# Patient Record
Sex: Female | Born: 1960 | Race: Black or African American | Hispanic: No | Marital: Single | Smoking: Never smoker
Health system: Southern US, Community
[De-identification: ages and names within clinical notes are randomized; demographics above are authoritative.]

## PROBLEM LIST (undated history)

## (undated) ENCOUNTER — Ambulatory Visit (HOSPITAL_COMMUNITY): Admission: EM | Disposition: A | Discharge status: Home / Self Care | Payer: Self-pay

## (undated) ENCOUNTER — Emergency Department (HOSPITAL_COMMUNITY): Admission: EM | Payer: Self-pay | Source: Home / Self Care

## (undated) DIAGNOSIS — K219 Gastro-esophageal reflux disease without esophagitis: Secondary | ICD-10-CM

## (undated) DIAGNOSIS — E559 Vitamin D deficiency, unspecified: Secondary | ICD-10-CM

## (undated) DIAGNOSIS — T7840XA Allergy, unspecified, initial encounter: Secondary | ICD-10-CM

## (undated) DIAGNOSIS — J302 Other seasonal allergic rhinitis: Secondary | ICD-10-CM

## (undated) DIAGNOSIS — D649 Anemia, unspecified: Secondary | ICD-10-CM

## (undated) DIAGNOSIS — F22 Delusional disorders: Secondary | ICD-10-CM

## (undated) DIAGNOSIS — F419 Anxiety disorder, unspecified: Secondary | ICD-10-CM

## (undated) HISTORY — DX: Vitamin D deficiency, unspecified: E55.9

## (undated) HISTORY — DX: Other seasonal allergic rhinitis: J30.2

## (undated) HISTORY — PX: BREAST SURGERY: SHX581

## (undated) HISTORY — DX: Allergy, unspecified, initial encounter: T78.40XA

## (undated) HISTORY — PX: BREAST BIOPSY: SHX20

## (undated) HISTORY — DX: Anemia, unspecified: D64.9

## (undated) HISTORY — DX: Anxiety disorder, unspecified: F41.9

## (undated) HISTORY — DX: Delusional disorders: F22

---

## 1997-10-31 DIAGNOSIS — N6019 Diffuse cystic mastopathy of unspecified breast: Secondary | ICD-10-CM | POA: Insufficient documentation

## 1997-11-05 ENCOUNTER — Ambulatory Visit (HOSPITAL_COMMUNITY): Admission: RE | Admit: 1997-11-05 | Discharge: 1997-11-05 | Payer: Self-pay | Admitting: *Deleted

## 1997-11-22 ENCOUNTER — Ambulatory Visit (HOSPITAL_COMMUNITY): Admission: RE | Admit: 1997-11-22 | Discharge: 1997-11-22 | Payer: Self-pay | Admitting: *Deleted

## 1998-09-23 ENCOUNTER — Emergency Department (HOSPITAL_COMMUNITY): Admission: EM | Admit: 1998-09-23 | Discharge: 1998-09-23 | Payer: Self-pay | Admitting: Emergency Medicine

## 1998-10-17 ENCOUNTER — Ambulatory Visit (HOSPITAL_COMMUNITY): Admission: RE | Admit: 1998-10-17 | Discharge: 1998-10-17 | Payer: Self-pay | Admitting: *Deleted

## 2000-03-25 ENCOUNTER — Encounter: Payer: Self-pay | Admitting: *Deleted

## 2000-03-25 ENCOUNTER — Ambulatory Visit (HOSPITAL_COMMUNITY): Admission: RE | Admit: 2000-03-25 | Discharge: 2000-03-25 | Payer: Self-pay | Admitting: *Deleted

## 2001-04-11 ENCOUNTER — Other Ambulatory Visit: Admission: RE | Admit: 2001-04-11 | Discharge: 2001-04-11 | Payer: Self-pay | Admitting: *Deleted

## 2001-04-15 ENCOUNTER — Encounter: Payer: Self-pay | Admitting: *Deleted

## 2001-04-15 ENCOUNTER — Ambulatory Visit (HOSPITAL_COMMUNITY): Admission: RE | Admit: 2001-04-15 | Discharge: 2001-04-15 | Payer: Self-pay | Admitting: *Deleted

## 2001-10-02 ENCOUNTER — Emergency Department (HOSPITAL_COMMUNITY): Admission: EM | Admit: 2001-10-02 | Discharge: 2001-10-02 | Payer: Self-pay

## 2002-04-18 ENCOUNTER — Ambulatory Visit (HOSPITAL_COMMUNITY): Admission: RE | Admit: 2002-04-18 | Discharge: 2002-04-18 | Payer: Self-pay | Admitting: *Deleted

## 2002-04-18 ENCOUNTER — Encounter: Payer: Self-pay | Admitting: *Deleted

## 2003-03-13 ENCOUNTER — Other Ambulatory Visit: Admission: RE | Admit: 2003-03-13 | Discharge: 2003-03-13 | Payer: Self-pay | Admitting: Family Medicine

## 2003-04-23 ENCOUNTER — Ambulatory Visit (HOSPITAL_COMMUNITY): Admission: RE | Admit: 2003-04-23 | Discharge: 2003-04-23 | Payer: Self-pay | Admitting: *Deleted

## 2003-04-23 ENCOUNTER — Encounter: Payer: Self-pay | Admitting: *Deleted

## 2004-04-17 DIAGNOSIS — K648 Other hemorrhoids: Secondary | ICD-10-CM | POA: Insufficient documentation

## 2004-04-23 ENCOUNTER — Ambulatory Visit (HOSPITAL_COMMUNITY): Admission: RE | Admit: 2004-04-23 | Discharge: 2004-04-23 | Payer: Self-pay | Admitting: Family Medicine

## 2005-04-27 ENCOUNTER — Ambulatory Visit (HOSPITAL_COMMUNITY): Admission: RE | Admit: 2005-04-27 | Discharge: 2005-04-27 | Payer: Self-pay | Admitting: Family Medicine

## 2005-10-23 ENCOUNTER — Other Ambulatory Visit: Admission: RE | Admit: 2005-10-23 | Discharge: 2005-10-23 | Payer: Self-pay | Admitting: Family Medicine

## 2005-10-26 ENCOUNTER — Encounter (INDEPENDENT_AMBULATORY_CARE_PROVIDER_SITE_OTHER): Payer: Self-pay | Admitting: Nurse Practitioner

## 2005-11-09 ENCOUNTER — Encounter: Admission: RE | Admit: 2005-11-09 | Discharge: 2005-11-09 | Payer: Self-pay | Admitting: Family Medicine

## 2006-07-06 ENCOUNTER — Emergency Department (HOSPITAL_COMMUNITY): Admission: EM | Admit: 2006-07-06 | Discharge: 2006-07-06 | Payer: Self-pay | Admitting: Emergency Medicine

## 2006-08-05 ENCOUNTER — Emergency Department (HOSPITAL_COMMUNITY): Admission: EM | Admit: 2006-08-05 | Discharge: 2006-08-06 | Payer: Self-pay | Admitting: Emergency Medicine

## 2006-08-11 ENCOUNTER — Other Ambulatory Visit: Admission: RE | Admit: 2006-08-11 | Discharge: 2006-08-11 | Payer: Self-pay | Admitting: Obstetrics and Gynecology

## 2006-09-06 ENCOUNTER — Ambulatory Visit (HOSPITAL_COMMUNITY): Admission: RE | Admit: 2006-09-06 | Discharge: 2006-09-06 | Payer: Self-pay | Admitting: Family Medicine

## 2007-03-16 ENCOUNTER — Ambulatory Visit (HOSPITAL_COMMUNITY): Admission: RE | Admit: 2007-03-16 | Discharge: 2007-03-16 | Payer: Self-pay | Admitting: Family Medicine

## 2007-09-04 ENCOUNTER — Emergency Department (HOSPITAL_COMMUNITY): Admission: EM | Admit: 2007-09-04 | Discharge: 2007-09-04 | Payer: Self-pay | Admitting: Emergency Medicine

## 2007-11-13 ENCOUNTER — Emergency Department (HOSPITAL_COMMUNITY): Admission: EM | Admit: 2007-11-13 | Discharge: 2007-11-13 | Payer: Self-pay | Admitting: Emergency Medicine

## 2008-03-29 ENCOUNTER — Emergency Department (HOSPITAL_COMMUNITY): Admission: EM | Admit: 2008-03-29 | Discharge: 2008-03-29 | Payer: Self-pay | Admitting: Family Medicine

## 2008-04-11 ENCOUNTER — Emergency Department (HOSPITAL_COMMUNITY): Admission: EM | Admit: 2008-04-11 | Discharge: 2008-04-11 | Payer: Self-pay | Admitting: Emergency Medicine

## 2008-06-20 ENCOUNTER — Ambulatory Visit: Payer: Self-pay | Admitting: Nurse Practitioner

## 2008-06-20 DIAGNOSIS — H547 Unspecified visual loss: Secondary | ICD-10-CM

## 2008-06-20 DIAGNOSIS — K219 Gastro-esophageal reflux disease without esophagitis: Secondary | ICD-10-CM

## 2008-06-20 LAB — CONVERTED CEMR LAB
Albumin: 4.4 g/dL (ref 3.5–5.2)
BUN: 12 mg/dL (ref 6–23)
CO2: 26 meq/L (ref 19–32)
Calcium: 9.2 mg/dL (ref 8.4–10.5)
Creatinine, Ser: 0.83 mg/dL (ref 0.40–1.20)
Eosinophils Absolute: 0.2 10*3/uL (ref 0.0–0.7)
Eosinophils Relative: 3 % (ref 0–5)
Glucose, Bld: 65 mg/dL — ABNORMAL LOW (ref 70–99)
MCHC: 32.7 g/dL (ref 30.0–36.0)
MCV: 90.8 fL (ref 78.0–100.0)
Monocytes Absolute: 0.4 10*3/uL (ref 0.1–1.0)
Monocytes Relative: 8 % (ref 3–12)
Neutro Abs: 2.7 10*3/uL (ref 1.7–7.7)
Neutrophils Relative %: 52 % (ref 43–77)
Potassium: 4.4 meq/L (ref 3.5–5.3)
RBC: 4.58 M/uL (ref 3.87–5.11)
Total Bilirubin: 0.4 mg/dL (ref 0.3–1.2)
Total Protein: 7.5 g/dL (ref 6.0–8.3)
WBC: 5.1 10*3/uL (ref 4.0–10.5)

## 2008-06-21 ENCOUNTER — Encounter (INDEPENDENT_AMBULATORY_CARE_PROVIDER_SITE_OTHER): Payer: Self-pay | Admitting: Nurse Practitioner

## 2008-06-26 ENCOUNTER — Encounter (INDEPENDENT_AMBULATORY_CARE_PROVIDER_SITE_OTHER): Payer: Self-pay | Admitting: Nurse Practitioner

## 2008-06-26 ENCOUNTER — Ambulatory Visit (HOSPITAL_COMMUNITY): Admission: RE | Admit: 2008-06-26 | Discharge: 2008-06-26 | Payer: Self-pay | Admitting: Family Medicine

## 2008-07-27 ENCOUNTER — Emergency Department (HOSPITAL_COMMUNITY): Admission: EM | Admit: 2008-07-27 | Discharge: 2008-07-27 | Payer: Self-pay | Admitting: Emergency Medicine

## 2008-08-29 ENCOUNTER — Ambulatory Visit (HOSPITAL_COMMUNITY): Admission: RE | Admit: 2008-08-29 | Discharge: 2008-08-29 | Payer: Self-pay | Admitting: Family Medicine

## 2008-10-03 ENCOUNTER — Ambulatory Visit: Payer: Self-pay | Admitting: Nurse Practitioner

## 2008-10-03 DIAGNOSIS — J069 Acute upper respiratory infection, unspecified: Secondary | ICD-10-CM | POA: Insufficient documentation

## 2008-10-03 DIAGNOSIS — R609 Edema, unspecified: Secondary | ICD-10-CM | POA: Insufficient documentation

## 2008-10-06 ENCOUNTER — Emergency Department (HOSPITAL_COMMUNITY): Admission: EM | Admit: 2008-10-06 | Discharge: 2008-10-06 | Payer: Self-pay | Admitting: Emergency Medicine

## 2008-10-08 ENCOUNTER — Ambulatory Visit: Payer: Self-pay | Admitting: Nurse Practitioner

## 2008-10-10 ENCOUNTER — Encounter (INDEPENDENT_AMBULATORY_CARE_PROVIDER_SITE_OTHER): Payer: Self-pay | Admitting: *Deleted

## 2008-10-11 ENCOUNTER — Encounter (INDEPENDENT_AMBULATORY_CARE_PROVIDER_SITE_OTHER): Payer: Self-pay | Admitting: Nurse Practitioner

## 2008-10-11 DIAGNOSIS — I959 Hypotension, unspecified: Secondary | ICD-10-CM | POA: Insufficient documentation

## 2008-10-12 ENCOUNTER — Encounter (INDEPENDENT_AMBULATORY_CARE_PROVIDER_SITE_OTHER): Payer: Self-pay | Admitting: Nurse Practitioner

## 2008-10-29 ENCOUNTER — Emergency Department (HOSPITAL_COMMUNITY): Admission: EM | Admit: 2008-10-29 | Discharge: 2008-10-30 | Payer: Self-pay | Admitting: Emergency Medicine

## 2008-11-30 ENCOUNTER — Ambulatory Visit: Payer: Self-pay | Admitting: Nurse Practitioner

## 2008-11-30 DIAGNOSIS — R51 Headache: Secondary | ICD-10-CM

## 2008-11-30 DIAGNOSIS — F22 Delusional disorders: Secondary | ICD-10-CM | POA: Insufficient documentation

## 2008-11-30 LAB — CONVERTED CEMR LAB
ALT: 26 units/L (ref 0–35)
AST: 17 units/L (ref 0–37)
BUN: 8 mg/dL (ref 6–23)
Basophils Absolute: 0 10*3/uL (ref 0.0–0.1)
CO2: 24 meq/L (ref 19–32)
Calcium: 9.1 mg/dL (ref 8.4–10.5)
Chloride: 102 meq/L (ref 96–112)
Eosinophils Absolute: 0.1 10*3/uL (ref 0.0–0.7)
Eosinophils Relative: 2 % (ref 0–5)
HCT: 41.8 % (ref 36.0–46.0)
Lymphocytes Relative: 30 % (ref 12–46)
Neutro Abs: 3.8 10*3/uL (ref 1.7–7.7)
Platelets: 247 10*3/uL (ref 150–400)
Potassium: 4.1 meq/L (ref 3.5–5.3)
RBC: 4.53 M/uL (ref 3.87–5.11)
RDW: 13.5 % (ref 11.5–15.5)
Total Bilirubin: 0.9 mg/dL (ref 0.3–1.2)
WBC: 6.3 10*3/uL (ref 4.0–10.5)

## 2008-12-03 ENCOUNTER — Encounter (INDEPENDENT_AMBULATORY_CARE_PROVIDER_SITE_OTHER): Payer: Self-pay | Admitting: Nurse Practitioner

## 2009-01-10 ENCOUNTER — Ambulatory Visit: Payer: Self-pay | Admitting: Nurse Practitioner

## 2009-02-12 ENCOUNTER — Ambulatory Visit: Payer: Self-pay | Admitting: Nurse Practitioner

## 2009-02-19 ENCOUNTER — Encounter (INDEPENDENT_AMBULATORY_CARE_PROVIDER_SITE_OTHER): Payer: Self-pay | Admitting: Nurse Practitioner

## 2009-03-12 ENCOUNTER — Encounter (INDEPENDENT_AMBULATORY_CARE_PROVIDER_SITE_OTHER): Payer: Self-pay | Admitting: Nurse Practitioner

## 2009-03-16 ENCOUNTER — Emergency Department (HOSPITAL_COMMUNITY): Admission: EM | Admit: 2009-03-16 | Discharge: 2009-03-16 | Payer: Self-pay | Admitting: Emergency Medicine

## 2009-03-18 ENCOUNTER — Telehealth (INDEPENDENT_AMBULATORY_CARE_PROVIDER_SITE_OTHER): Payer: Self-pay | Admitting: Nurse Practitioner

## 2009-06-20 ENCOUNTER — Ambulatory Visit: Payer: Self-pay | Admitting: Nurse Practitioner

## 2009-06-20 DIAGNOSIS — M542 Cervicalgia: Secondary | ICD-10-CM | POA: Insufficient documentation

## 2009-11-20 ENCOUNTER — Ambulatory Visit (HOSPITAL_COMMUNITY): Admission: RE | Admit: 2009-11-20 | Discharge: 2009-11-20 | Payer: Self-pay | Admitting: Family Medicine

## 2009-12-14 ENCOUNTER — Emergency Department (HOSPITAL_COMMUNITY): Admission: EM | Admit: 2009-12-14 | Discharge: 2009-12-14 | Payer: Self-pay | Admitting: Emergency Medicine

## 2010-03-10 ENCOUNTER — Encounter (INDEPENDENT_AMBULATORY_CARE_PROVIDER_SITE_OTHER): Payer: Self-pay | Admitting: Nurse Practitioner

## 2010-09-09 ENCOUNTER — Telehealth (INDEPENDENT_AMBULATORY_CARE_PROVIDER_SITE_OTHER): Payer: Self-pay | Admitting: Nurse Practitioner

## 2010-09-20 ENCOUNTER — Emergency Department (HOSPITAL_COMMUNITY)
Admission: EM | Admit: 2010-09-20 | Discharge: 2010-09-20 | Payer: Self-pay | Source: Home / Self Care | Admitting: Family Medicine

## 2010-10-14 ENCOUNTER — Ambulatory Visit: Admit: 2010-10-14 | Payer: Self-pay | Admitting: Nurse Practitioner

## 2010-11-04 NOTE — Letter (Signed)
Summary: MAILED REQUESTEED RECORDS TO [facility]  MAILED REQUESTEED RECORDS TO [facility]   Imported By: Arta Bruce 03/10/2010 16:30:24  _____________________________________________________________________  External Attachment:    Type:   Image     Comment:   External Document

## 2010-11-04 NOTE — Progress Notes (Signed)
Summary: SKIN PEELING/BUMPS ON ANKLE  Phone Note Call from Patient Call back at Home Phone [phone]   Reason for Call: Talk to Nurse Summary of Call: [name] CALLE AND SAYS THAT SHE HAS SOME RED BUMPS THAT ARE AROUND HER ANKLE AREA, THEY DO NOT HURT AND HAVE NO PAIN W/IT, ALSO THE SKIN IS PEELING AND SHE SAYS THAT IT HAS SOME TIGHTNESS . Initial call taken by: Leodis Rains,  September 09, 2010 10:23 AM  Follow-up for Phone Call        Voicemail has not been set up yet.  Dutch Quint RN  September 09, 2010 4:03 PM   No answer, Voicemail has not been setup yet. Gaylyn Cheers RN  September 10, 2010 8:50 AM  no answer, voicemail has not been setup yet. have attempted 3 different days to call pt. back and have been unable to reach her. Will sign off expect pt will call us back if she needs to.    Follow-up by: Gaylyn Cheers RN,  September 11, 2010 2:54 PM

## 2010-12-09 ENCOUNTER — Other Ambulatory Visit (HOSPITAL_COMMUNITY): Payer: Self-pay | Admitting: Family Medicine

## 2010-12-09 DIAGNOSIS — Z1231 Encounter for screening mammogram for malignant neoplasm of breast: Secondary | ICD-10-CM

## 2010-12-11 ENCOUNTER — Ambulatory Visit (HOSPITAL_COMMUNITY)
Admission: RE | Admit: 2010-12-11 | Discharge: 2010-12-11 | Disposition: A | Discharge status: Home / Self Care | Payer: Self-pay | Source: Ambulatory Visit | Attending: Family Medicine | Admitting: Family Medicine

## 2010-12-11 DIAGNOSIS — Z1231 Encounter for screening mammogram for malignant neoplasm of breast: Secondary | ICD-10-CM | POA: Insufficient documentation

## 2011-01-19 LAB — COMPREHENSIVE METABOLIC PANEL
ALT: 27 U/L (ref 0–35)
AST: 33 U/L (ref 0–37)
CO2: 24 mEq/L (ref 19–32)
Calcium: 8.8 mg/dL (ref 8.4–10.5)
Creatinine, Ser: 0.64 mg/dL (ref 0.4–1.2)
GFR calc non Af Amer: >60 mL/min (ref >60)
Total Bilirubin: 0.7 mg/dL (ref 0.3–1.2)

## 2011-01-19 LAB — URINALYSIS, ROUTINE W REFLEX MICROSCOPIC
Glucose, UA: NEGATIVE mg/dL
Ketones, ur: NEGATIVE mg/dL
Protein, ur: NEGATIVE mg/dL

## 2011-01-19 LAB — CBC
HCT: 36.3 % (ref 36.0–46.0)
MCHC: 33.9 g/dL (ref 30.0–36.0)
MCV: 92 fL (ref 78.0–100.0)
RBC: 3.95 MIL/uL (ref 3.87–5.11)
RDW: 12.7 % (ref 11.5–15.5)
WBC: 5.8 10*3/uL (ref 4.0–10.5)

## 2011-01-19 LAB — URINE MICROSCOPIC-ADD ON

## 2011-01-19 LAB — DIFFERENTIAL
Basophils Absolute: 0.1 10*3/uL (ref 0.0–0.1)
Neutrophils Relative %: 56 % (ref 43–77)

## 2011-01-19 LAB — LIPASE, BLOOD: Lipase: 47 U/L (ref 11–59)

## 2011-03-17 ENCOUNTER — Emergency Department (HOSPITAL_COMMUNITY)
Admission: EM | Admit: 2011-03-17 | Discharge: 2011-03-17 | Discharge status: Against Medical Advice | Payer: Self-pay | Attending: Emergency Medicine | Admitting: Emergency Medicine

## 2011-03-17 DIAGNOSIS — R109 Unspecified abdominal pain: Secondary | ICD-10-CM | POA: Insufficient documentation

## 2011-06-07 ENCOUNTER — Emergency Department (HOSPITAL_COMMUNITY)
Admission: EM | Admit: 2011-06-07 | Discharge: 2011-06-07 | Disposition: A | Discharge status: Home / Self Care | Payer: Self-pay | Attending: Emergency Medicine | Admitting: Emergency Medicine

## 2011-06-07 ENCOUNTER — Emergency Department (HOSPITAL_COMMUNITY): Payer: Self-pay

## 2011-06-07 DIAGNOSIS — R6884 Jaw pain: Secondary | ICD-10-CM | POA: Insufficient documentation

## 2011-06-07 DIAGNOSIS — R42 Dizziness and giddiness: Secondary | ICD-10-CM | POA: Insufficient documentation

## 2011-06-07 DIAGNOSIS — R209 Unspecified disturbances of skin sensation: Secondary | ICD-10-CM | POA: Insufficient documentation

## 2011-06-07 DIAGNOSIS — R51 Headache: Secondary | ICD-10-CM | POA: Insufficient documentation

## 2011-06-07 LAB — CBC
MCV: 88.8 fL (ref 78.0–100.0)
Platelets: 232 10*3/uL (ref 150–400)
RDW: 13.2 % (ref 11.5–15.5)
WBC: 7.4 10*3/uL (ref 4.0–10.5)

## 2011-06-07 LAB — URINE MICROSCOPIC-ADD ON

## 2011-06-07 LAB — POCT I-STAT, CHEM 8
BUN: 9 mg/dL (ref 6–23)
Calcium, Ion: 1.11 mmol/L — ABNORMAL LOW (ref 1.12–1.32)
Chloride: 105 meq/L (ref 96–112)
Creatinine, Ser: 0.8 mg/dL (ref 0.50–1.10)
Glucose, Bld: 101 mg/dL — ABNORMAL HIGH (ref 70–99)
HCT: 41 % (ref 36.0–46.0)
Hemoglobin: 13.9 g/dL (ref 12.0–15.0)
Potassium: 4.1 meq/L (ref 3.5–5.1)
Sodium: 138 meq/L (ref 135–145)
TCO2: 23 mmol/L (ref 0–100)

## 2011-06-07 LAB — POCT I-STAT TROPONIN I: Troponin i, poc: 0 ng/mL (ref 0.00–0.08)

## 2011-06-07 LAB — URINALYSIS, ROUTINE W REFLEX MICROSCOPIC
Glucose, UA: NEGATIVE mg/dL
pH: 7.5 (ref 5.0–8.0)

## 2011-06-07 LAB — DIFFERENTIAL
Eosinophils Absolute: 0 10*3/uL (ref 0.0–0.7)
Eosinophils Relative: 0 % (ref 0–5)
Lymphs Abs: 1.2 10*3/uL (ref 0.7–4.0)

## 2011-06-26 LAB — BASIC METABOLIC PANEL
BUN: 4 — ABNORMAL LOW
CO2: 25
Chloride: 107
GFR calc non Af Amer: >60
Glucose, Bld: 106 — ABNORMAL HIGH
Potassium: 3.5

## 2011-06-26 LAB — POCT CARDIAC MARKERS
CKMB, poc: 1.3
Myoglobin, poc: 50.8
Operator id: 4533

## 2011-06-26 LAB — DIFFERENTIAL
Basophils Absolute: 0
Basophils Relative: 1
Eosinophils Absolute: 0
Eosinophils Relative: 0

## 2011-06-26 LAB — CBC
HCT: 37.4
MCHC: 33.5
MCV: 90.2
Platelets: 206
RDW: 12.9

## 2011-06-26 LAB — POCT PREGNANCY, URINE: Preg Test, Ur: NEGATIVE

## 2011-06-26 LAB — D-DIMER, QUANTITATIVE: D-Dimer, Quant: 0.37

## 2011-07-02 LAB — POCT PREGNANCY, URINE
Operator id: 239701
Preg Test, Ur: NEGATIVE

## 2011-07-02 LAB — POCT URINALYSIS DIP (DEVICE)
Hgb urine dipstick: NEGATIVE
Nitrite: NEGATIVE
Protein, ur: NEGATIVE
pH: 8.5 — ABNORMAL HIGH

## 2011-07-02 LAB — URINE CULTURE

## 2011-07-06 ENCOUNTER — Emergency Department (HOSPITAL_COMMUNITY)
Admission: EM | Admit: 2011-07-06 | Discharge: 2011-07-06 | Discharge status: Against Medical Advice | Payer: Self-pay | Attending: Emergency Medicine | Admitting: Emergency Medicine

## 2011-07-06 LAB — URINALYSIS, ROUTINE W REFLEX MICROSCOPIC
Glucose, UA: NEGATIVE
Hgb urine dipstick: NEGATIVE
Specific Gravity, Urine: 1.015
Urobilinogen, UA: 0.2

## 2011-07-06 LAB — POCT PREGNANCY, URINE: Preg Test, Ur: NEGATIVE

## 2011-07-24 ENCOUNTER — Emergency Department (HOSPITAL_COMMUNITY)
Admission: EM | Admit: 2011-07-24 | Discharge: 2011-07-25 | Disposition: A | Discharge status: Home / Self Care | Payer: Self-pay | Attending: Emergency Medicine | Admitting: Emergency Medicine

## 2011-07-24 DIAGNOSIS — M549 Dorsalgia, unspecified: Secondary | ICD-10-CM | POA: Insufficient documentation

## 2011-07-24 DIAGNOSIS — M545 Low back pain, unspecified: Secondary | ICD-10-CM | POA: Insufficient documentation

## 2011-08-16 ENCOUNTER — Emergency Department (INDEPENDENT_AMBULATORY_CARE_PROVIDER_SITE_OTHER)
Admission: EM | Admit: 2011-08-16 | Discharge: 2011-08-16 | Disposition: A | Discharge status: Home / Self Care | Payer: Self-pay | Source: Home / Self Care

## 2011-08-16 DIAGNOSIS — N390 Urinary tract infection, site not specified: Secondary | ICD-10-CM

## 2011-08-16 HISTORY — DX: Gastro-esophageal reflux disease without esophagitis: K21.9

## 2011-08-16 LAB — POCT URINALYSIS DIP (DEVICE)
Glucose, UA: NEGATIVE mg/dL
Nitrite: NEGATIVE
Protein, ur: NEGATIVE mg/dL
Specific Gravity, Urine: 1.02 (ref 1.005–1.030)
Urobilinogen, UA: 0.2 mg/dL (ref 0.0–1.0)

## 2011-08-16 LAB — POCT PREGNANCY, URINE: Preg Test, Ur: NEGATIVE

## 2011-08-16 MED ORDER — NITROFURANTOIN MONOHYD MACRO 100 MG PO CAPS: 100.0000 mg | ORAL_CAPSULE | Freq: Two times a day (BID) | ORAL | Status: AC

## 2011-08-16 NOTE — ED Notes (Signed)
Pt states she had her car serviced yesterday and now when she presses the gas pedal she feels a "electrical shock", today she states she feels like her legs are shakey and "giving out"

## 2011-08-16 NOTE — ED Provider Notes (Signed)
History     CSN: [id number] Arrival date & time: 08/16/2011  4:11 PM   First MD Initiated Contact with Patient 08/16/11 1546      Chief Complaint  Patient presents with  . Leg Pain    Pt states she has leg pain that started yesterday    (Consider location/radiation/quality/duration/timing/severity/associated sxs/prior treatment) HPI Comments: Pt reports sharp "electric shock" sensation going up her right calf yesterday when stepping on the gas pedal. Today reports bilateral lower leg "shakiness" while walking which resolved with rest. States has never happened before. No leg pain, cramping, asymmetric swelling, redness, color change, bruising, deformity, temperature change edema of feet, ankles. . 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 No change in footwear. No recent/remote h/o trauma to legs, feet, no h/o PVD, diabetes, vitamin deficienes. Pt nonsmoker. Pt unable to identify what brings on sx- has not had any furhter episodes while walking long distances.   Patient is a 50 y.o. female presenting with leg pain. The history is provided by the patient. No language interpreter was used.  Leg Pain  The incident occurred yesterday. The pain is present in the left leg and right leg. The quality of the pain is described as sharp. The pain has been intermittent since onset. Pertinent negatives include no numbness, no inability to bear weight, no loss of motion, no muscle weakness and no tingling. The symptoms are aggravated by nothing. She has tried nothing for the symptoms.    Past Medical History  Diagnosis Date  . Acid reflux     History reviewed. No pertinent past surgical history.  History reviewed. No pertinent family history.  History  Substance Use Topics  . Smoking status: Never Smoker   . Smokeless tobacco: Not on file  . Alcohol Use: No    OB History    Grav Para Term Preterm Abortions TAB SAB Ect Mult Living                  Review of  Systems  Constitutional: Negative for fever and unexpected weight change.  Respiratory: Negative for chest tightness and shortness of breath.   Cardiovascular: Negative for chest pain and palpitations.  Gastrointestinal: Negative for nausea.  Musculoskeletal: Negative for myalgias, back pain, joint swelling, arthralgias and gait problem.  Skin: Negative for color change, rash and wound.  Neurological: Negative for tingling, syncope, facial asymmetry, speech difficulty, weakness and numbness.    Allergies  Eggs or egg-derived products  Home Medications   Current Outpatient Rx  Name Route Sig Dispense Refill  . LANSOPRAZOLE 30 MG PO CPDR Oral Take 30 mg by mouth daily.        BP 120/78  Pulse 75  Temp(Src) 98.9 F (37.2 C) (Oral)  Resp 16  SpO2 100%  LMP 08/03/2011  Physical Exam  Nursing note and vitals reviewed. Constitutional: She is oriented to person, place, and time. She appears well-developed and well-nourished.  HENT:  Head: Normocephalic and atraumatic.  Eyes: Conjunctivae and EOM are normal. Pupils are equal, round, and reactive to light.  Neck: Normal range of motion.  Cardiovascular: Normal rate, regular rhythm, normal heart sounds and intact distal pulses.   No murmur heard. Pulmonary/Chest: Effort normal and breath sounds normal. No respiratory distress. She has no wheezes. She has no rales. She exhibits no tenderness.  Abdominal: Soft. Bowel sounds are normal. She exhibits no distension. There is no tenderness.  Musculoskeletal: Normal range of motion. She exhibits no edema  and no tenderness.       Full painless ROM bilateral ankles, knees, hips. Dp 2+ bilaterally. Calves symmetric. Cap refill <2 secs bilaterally. No brusing, bony tenderness over feet, lower extremieties. No pain with palation plantar fascia  Neurological: She is alert and oriented to person, place, and time.  Skin: Skin is warm and dry.  Psychiatric: She has a normal mood and affect. Her  behavior is normal. Judgment and thought content normal.    ED Course  Procedures (including critical care time)  Labs Reviewed  POCT URINALYSIS DIP (DEVICE) - Abnormal; Notable for the following:    Leukocytes, UA MODERATE (*) Biochemical Testing Only. Please order routine urinalysis from main lab if confirmatory testing is needed.   All other components within normal limits  POCT URINALYSIS DIPSTICK  POCT PREGNANCY, URINE  POCT PREGNANCY, URINE   Results for orders placed during the hospital encounter of 08/16/11  POCT URINALYSIS DIP (DEVICE)      Component Value Range   Glucose, UA NEGATIVE  NEGATIVE (mg/dL)   Bilirubin Urine NEGATIVE  NEGATIVE    Ketones, ur NEGATIVE  NEGATIVE (mg/dL)   Specific Gravity, Urine 1.020  1.005 - 1.030    Hgb urine dipstick NEGATIVE  NEGATIVE    pH 7.5  5.0 - 8.0    Protein, ur NEGATIVE  NEGATIVE (mg/dL)   Urobilinogen, UA 0.2  0.0 - 1.0 (mg/dL)   Nitrite NEGATIVE  NEGATIVE    Leukocytes, UA MODERATE (*) NEGATIVE   POCT PREGNANCY, URINE      Component Value Range   Preg Test, Ur NEGATIVE        MDM       Danella Maiers [facility] 08/16/11 2149

## 2011-09-27 ENCOUNTER — Encounter (HOSPITAL_COMMUNITY): Payer: Self-pay | Admitting: *Deleted

## 2011-09-27 ENCOUNTER — Emergency Department (INDEPENDENT_AMBULATORY_CARE_PROVIDER_SITE_OTHER)
Admission: EM | Admit: 2011-09-27 | Discharge: 2011-09-27 | Disposition: A | Discharge status: Home / Self Care | Payer: Self-pay | Source: Home / Self Care | Attending: Emergency Medicine | Admitting: Emergency Medicine

## 2011-09-27 ENCOUNTER — Emergency Department (INDEPENDENT_AMBULATORY_CARE_PROVIDER_SITE_OTHER): Payer: Self-pay

## 2011-09-27 ENCOUNTER — Other Ambulatory Visit: Payer: Self-pay

## 2011-09-27 DIAGNOSIS — S60031A Contusion of right middle finger without damage to nail, initial encounter: Secondary | ICD-10-CM

## 2011-09-27 DIAGNOSIS — S6000XA Contusion of unspecified finger without damage to nail, initial encounter: Secondary | ICD-10-CM

## 2011-09-27 DIAGNOSIS — K219 Gastro-esophageal reflux disease without esophagitis: Secondary | ICD-10-CM

## 2011-09-27 MED ORDER — GI COCKTAIL ~~LOC~~
30.0000 mL | Freq: Once | ORAL | Status: AC
  Administered 2011-09-27: 30 mL via ORAL

## 2011-09-27 MED ORDER — GI COCKTAIL ~~LOC~~
ORAL | Status: AC
  Filled 2011-09-27: qty 30

## 2011-09-27 NOTE — ED Provider Notes (Signed)
History     CSN: [id number]  Arrival date & time 09/27/11  0904   First MD Initiated Contact with Patient 09/27/11 [phone]      Chief Complaint  Patient presents with  . Hand Pain  . Chest Pain    (Consider location/radiation/quality/duration/timing/severity/associated sxs/prior treatment) HPI Comments: Kathy Gilbert is in today for several problems: Shocklike sensations to her right foot from her car, chest pain, and a right middle finger injury.  Over the past month she's had shocklike sensations from the gas pedal of her car. His feels like a vibration, not exactly like an electric shock. This happened first a month ago. She took the car and had a new gas pedal installed. She then got the car back in the same thing happened yesterday. She has taken the car back to the repair shop. In the meantime she wanted to get checked to make sure there was no damage to her nervous system. She denies any pain in the foot, numbness, tingling, or weakness.  Secondly she has had chest pain this morning for about an hour. This is substernal without radiation, described as an ache, and is 2/10 in intensity and she denies any associated symptoms of shortness of breath, diaphoresis, nausea, palpitations, dizziness, weakness, indigestion, or heartburn. She does have a history of reflux and is on Prevacid. She took a pill yesterday. She has no cardiac history.  Third she injured her right middle finger about a week ago at home. She hit a towel rack. She had a small laceration there that healed up on its own. She has a little bit of soreness over the proximal phalanx but good range of motion of all joints and no numbness or tingling. She just wanted to get it x-rayed.  Patient is a 50 y.o. female presenting with hand pain.  Hand Pain Associated symptoms include chest pain. Pertinent negatives include no abdominal pain and no shortness of breath.    Past Medical History  Diagnosis Date  . Acid reflux      History reviewed. No pertinent past surgical history.  History reviewed. No pertinent family history.  History  Substance Use Topics  . Smoking status: Never Smoker   . Smokeless tobacco: Not on file  . Alcohol Use: No    OB History    Grav Para Term Preterm Abortions TAB SAB Ect Mult Living                  Review of Systems  Constitutional: Negative for fever, chills, fatigue and unexpected weight change.  HENT: Negative for ear pain, congestion, sore throat and rhinorrhea.   Eyes: Negative for redness and visual disturbance.  Respiratory: Negative for cough, shortness of breath and wheezing.   Cardiovascular: Positive for chest pain. Negative for palpitations.  Gastrointestinal: Negative for nausea, vomiting, abdominal pain, diarrhea and constipation.  Genitourinary: Negative for dysuria, urgency and frequency.  Musculoskeletal: Positive for arthralgias.  Skin: Negative for rash.    Allergies  Eggs or egg-derived products  Home Medications   Current Outpatient Rx  Name Route Sig Dispense Refill  . LANSOPRAZOLE 30 MG PO CPDR Oral Take 30 mg by mouth daily.        LMP 09/25/2011  Physical Exam  Nursing note and vitals reviewed. Constitutional: She is oriented to person, place, and time. She appears well-developed and well-nourished. No distress.  HENT:  Head: Normocephalic and atraumatic.  Mouth/Throat: Oropharynx is clear and moist.  Eyes: Conjunctivae and EOM are normal. Pupils  are equal, round, and reactive to light. No scleral icterus.  Neck: Normal range of motion. Neck supple.  Cardiovascular: Normal rate, regular rhythm and normal heart sounds.  Exam reveals no gallop and no friction rub.   No murmur heard. Pulmonary/Chest: Effort normal and breath sounds normal. No respiratory distress. She has no wheezes. She has no rales.  Abdominal: Soft. Bowel sounds are normal. She exhibits no distension and no mass. There is no tenderness. There is no rebound  and no guarding.  Musculoskeletal: Normal range of motion. She exhibits no edema and no tenderness.  Lymphadenopathy:    She has no cervical adenopathy.  Neurological: She is alert and oriented to person, place, and time. She displays normal reflexes. No cranial nerve deficit. She exhibits normal muscle tone. Coordination normal.  Skin: Skin is warm and dry. No rash noted. She is not diaphoretic.    ED Course  Procedures (including critical care time)   Date: 09/27/2011  Rate: 73  Rhythm: normal sinus rhythm  QRS Axis: normal  Intervals: normal  ST/T Wave abnormalities: normal  Conduction Disutrbances:none  Narrative Interpretation: Normal sinus rhythm, right atrial enlargement, borderline EKG.  Old EKG Reviewed: none available   Labs Reviewed - No data to display Dg Finger Middle Right  09/27/2011  *RADIOLOGY REPORT*  Clinical Data: Pain post blunt trauma  RIGHT MIDDLE FINGER 2+V  Comparison: None.  Findings: Negative for fracture, dislocation, or other acute abnormality.  Normal alignment and mineralization. No significant degenerative change.  Regional soft tissues unremarkable.  IMPRESSION:  Negative  Original Report Authenticated By: Thora Lance III, M.D.     1. GERD (gastroesophageal reflux disease)   2. Contusion of right middle finger without damage to nail       MDM  First of all, she has no obvious injury to her finger, x-rays are normal, she has a full range of motion, and there is no pain to palpation. This appears to have old up on its own.  Second, I think her recent chest pain is caused by reflux esophagitis. There is no sign of cardiac disease or any other serious problem.  Finally, I don't see that there is any evidence of injury to her nervous system from her car problem. I told her it would be a good idea to get this fixed as soon as possible however, since it could be a sign of something more serious with a car.        Roque Lias,  MD 09/27/11 [phone]

## 2011-09-27 NOTE — ED Notes (Signed)
Has been getting "shocks" from gas pedal on car for over a month; had car serviced, but continues w/ some "small shocks"; c/o mid-chest pain intermittently x 1 hr.  States pain non-reproduceable.  Denies SOB, nausea.  Also c/o right middle finger pain x 1 wk "after hitting it on something".  C/O some swelling.

## 2011-12-12 ENCOUNTER — Encounter (HOSPITAL_COMMUNITY): Payer: Self-pay

## 2011-12-12 ENCOUNTER — Emergency Department (HOSPITAL_COMMUNITY)
Admission: EM | Admit: 2011-12-12 | Discharge: 2011-12-12 | Disposition: A | Discharge status: Home / Self Care | Payer: Self-pay | Source: Home / Self Care | Attending: Family Medicine | Admitting: Family Medicine

## 2011-12-12 DIAGNOSIS — S8012XA Contusion of left lower leg, initial encounter: Secondary | ICD-10-CM

## 2011-12-12 DIAGNOSIS — S8010XA Contusion of unspecified lower leg, initial encounter: Secondary | ICD-10-CM

## 2011-12-12 NOTE — Discharge Instructions (Signed)
I do not think that your leg is fractured. I think that this is a bruise of your bone. You also have some mild swelling in her legs, which could be due to gravity. Please monitor your symptoms and return to care should they worsen in any way. You may use over-the-counter creams such as Aspercreme, BenGay, or icy hot, for your symptoms. Return to care should you have any numbness, weakness or tingling in your leg.
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
## 2012-01-15 ENCOUNTER — Other Ambulatory Visit (HOSPITAL_COMMUNITY): Payer: Self-pay | Admitting: Family Medicine

## 2012-02-09 ENCOUNTER — Other Ambulatory Visit (HOSPITAL_COMMUNITY): Payer: Self-pay | Admitting: Family Medicine

## 2012-02-09 DIAGNOSIS — Z1231 Encounter for screening mammogram for malignant neoplasm of breast: Secondary | ICD-10-CM

## 2012-02-10 ENCOUNTER — Ambulatory Visit (HOSPITAL_COMMUNITY)
Admission: RE | Admit: 2012-02-10 | Discharge: 2012-02-10 | Disposition: A | Discharge status: Home / Self Care | Payer: Self-pay | Source: Ambulatory Visit | Attending: Family Medicine | Admitting: Family Medicine

## 2012-02-10 DIAGNOSIS — Z1231 Encounter for screening mammogram for malignant neoplasm of breast: Secondary | ICD-10-CM

## 2012-02-24 ENCOUNTER — Telehealth (HOSPITAL_COMMUNITY): Payer: Self-pay | Admitting: *Deleted

## 2012-02-24 NOTE — ED Notes (Signed)
Pt. called on VM and said she was here in January or Feb.  with Acid Reflux.  She said she needs the name of the medication prescribed for her.  1800 I called pt. back and told her she was here 09/27/11. The medication we gave was GI coctail. She said she needs to know what was in it for the insurance company. Pt. given ingredients, Maalox, Viscous Lidocaine and Donnatal Elixir.  Pt. had not further questions. Vassie Moselle 02/24/2012

## 2013-01-08 IMAGING — CT CT HEAD W/O CM
2 series · 16 of 30 positions shown, 20 images · non-contrast
Comparison: Head CT 11/13/2007

CLINICAL DATA: Headache and numbness

CT HEAD WITHOUT CONTRAST
TECHNIQUE: Contiguous axial images were obtained from the base of
the skull through the vertex without contrast.

[Series 2: head w/o · axial · non-contrast · 0.39mm/px · z∈[-151,-36]mm · 13 of 27 slices shown, 17 images]
[im 2/27  brain]
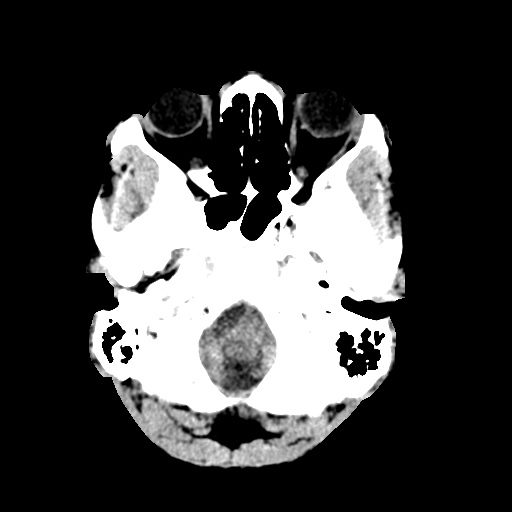
[im 2/27  bone]
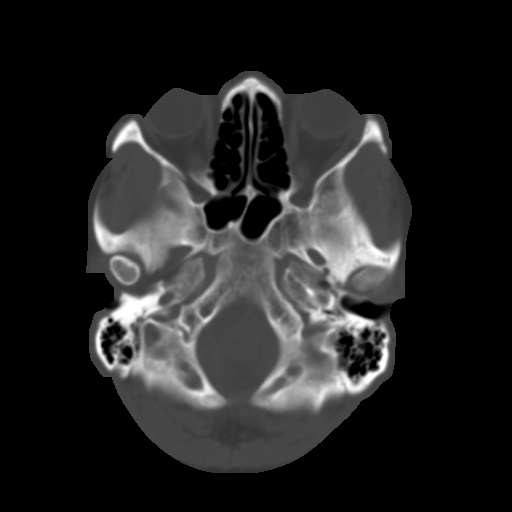
[im 4/27  brain]
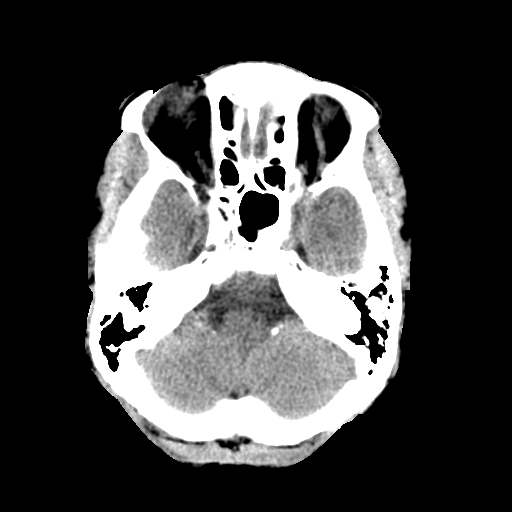
[im 6/27  brain]
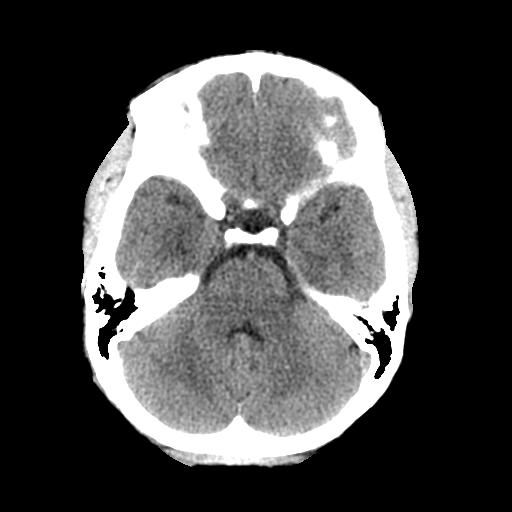
[im 8/27  brain]
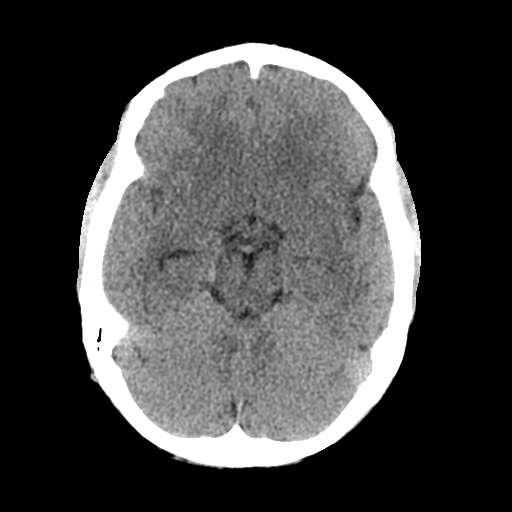
[im 10/27  brain]
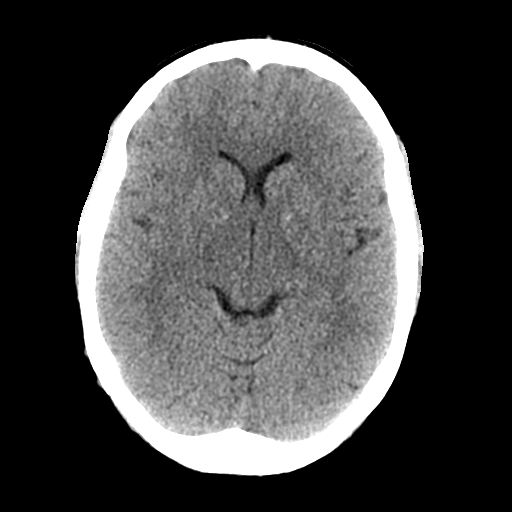
[im 10/27  bone]
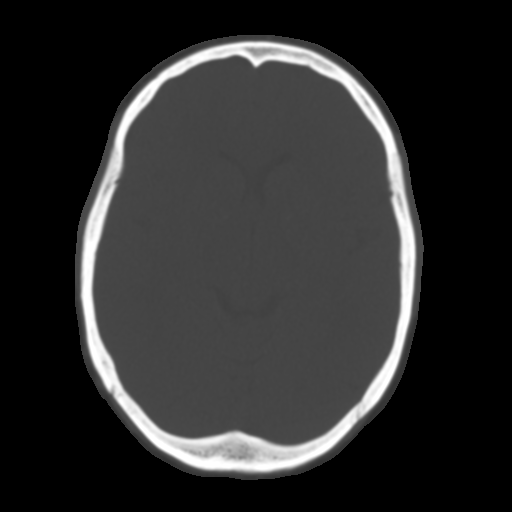
[im 12/27  brain]
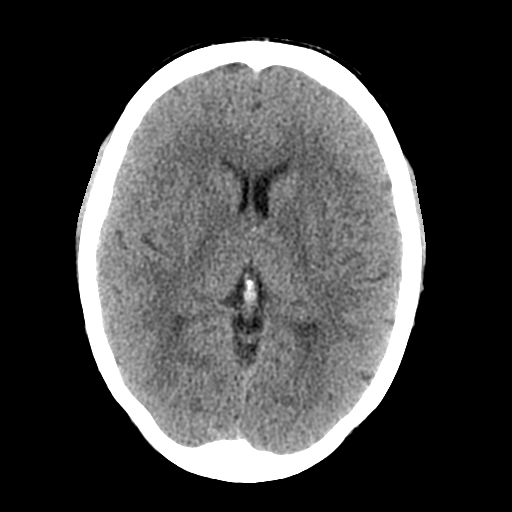
[im 14/27  brain]
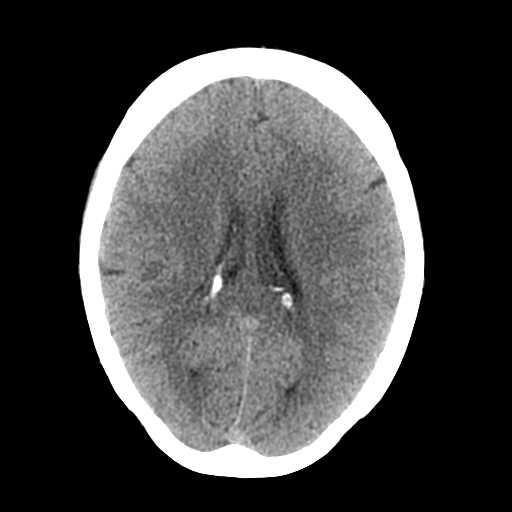
[im 15/27  brain]
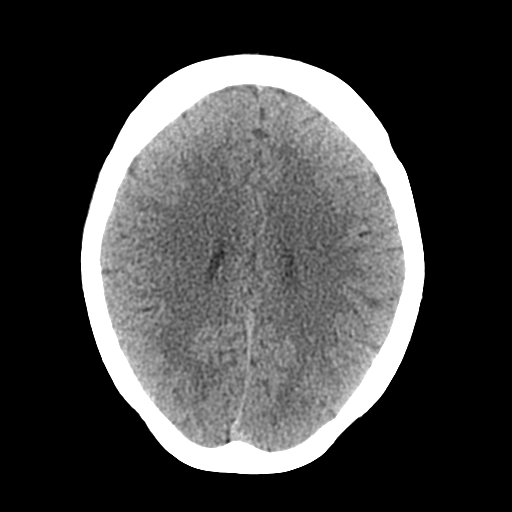
[im 17/27  brain]
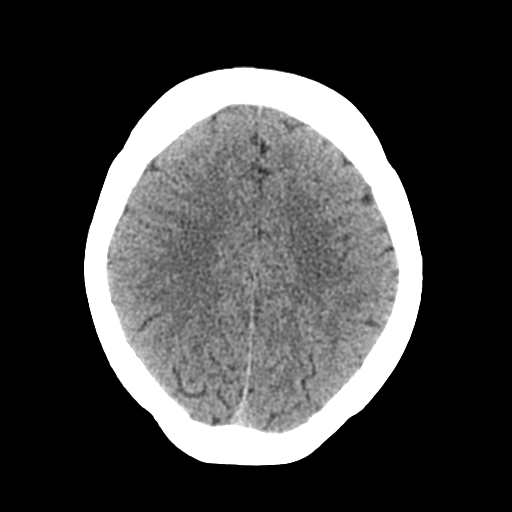
[im 17/27  bone]
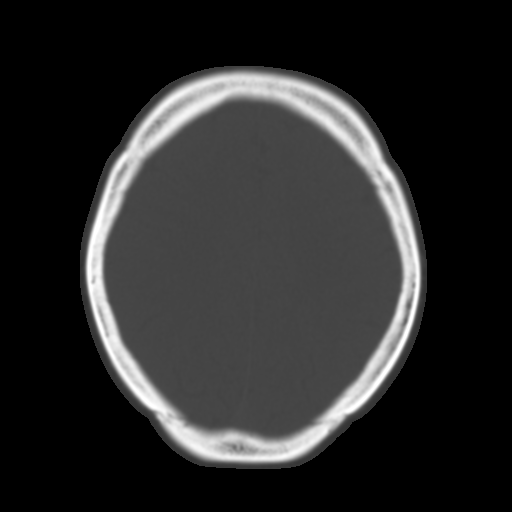
[im 19/27  brain]
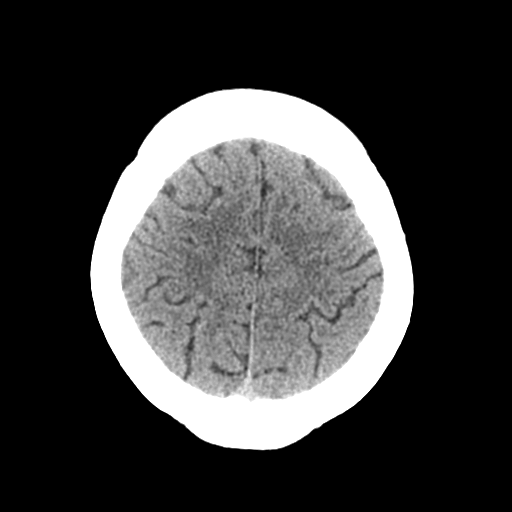
[im 21/27  brain]
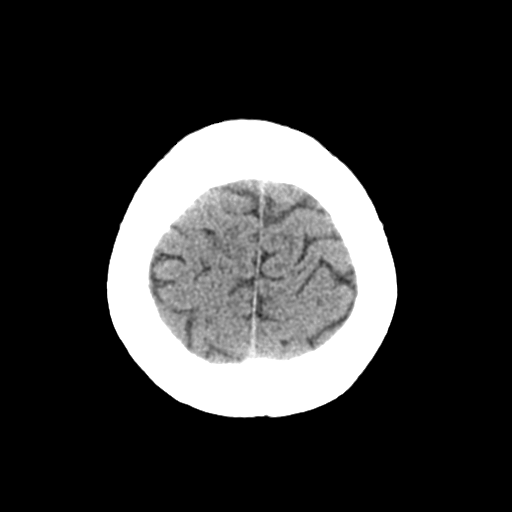
[im 23/27  brain]
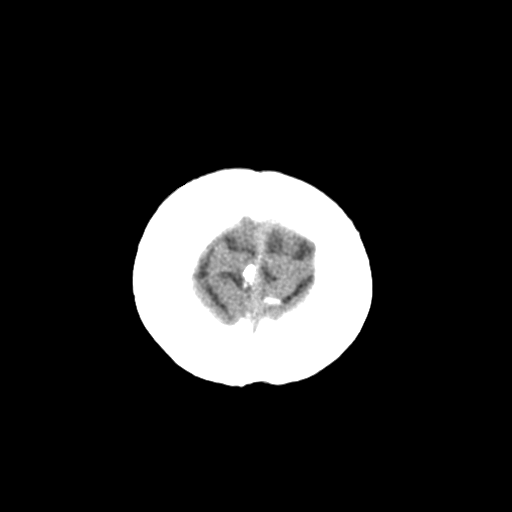
[im 25/27  brain]
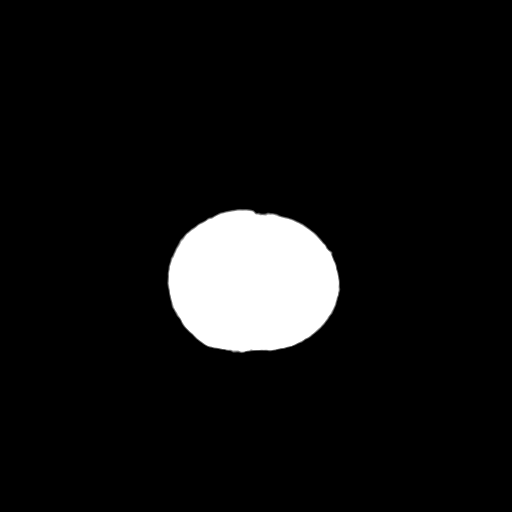
[im 25/27  bone]
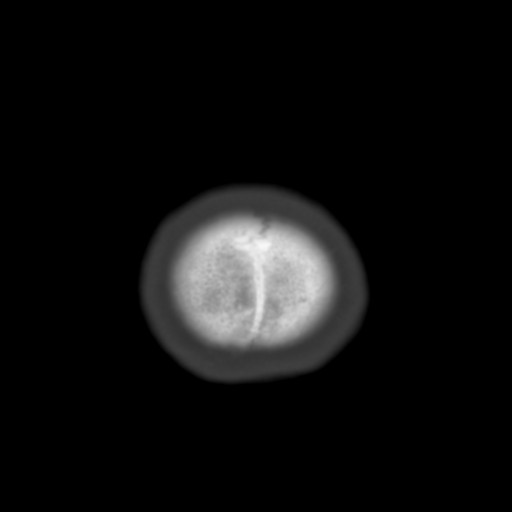

[Series 3: bone window · axial · 0.39mm/px · z∈[-151,-111]mm · 3 of 27 slices shown]
[im 2/27  bone]
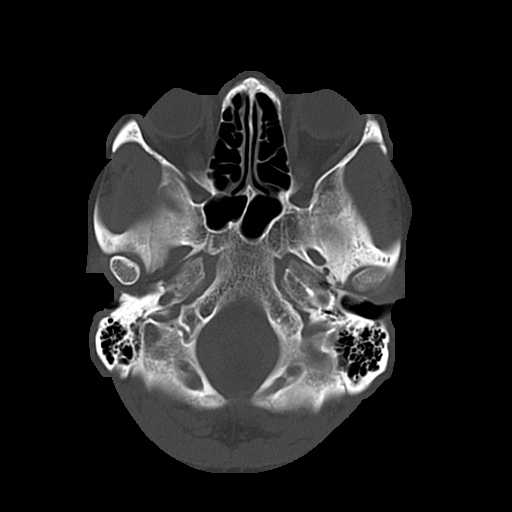
[im 6/27  bone]
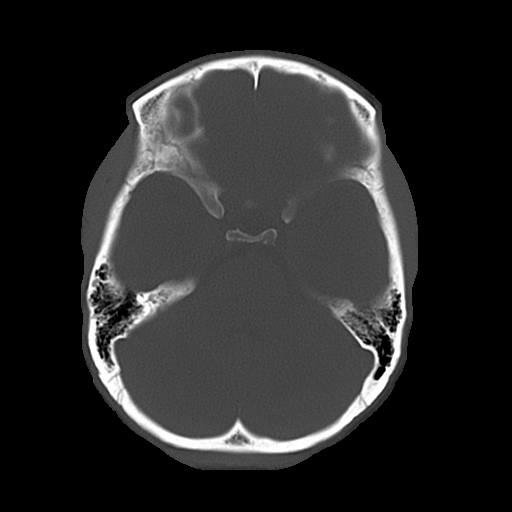
[im 10/27  bone]
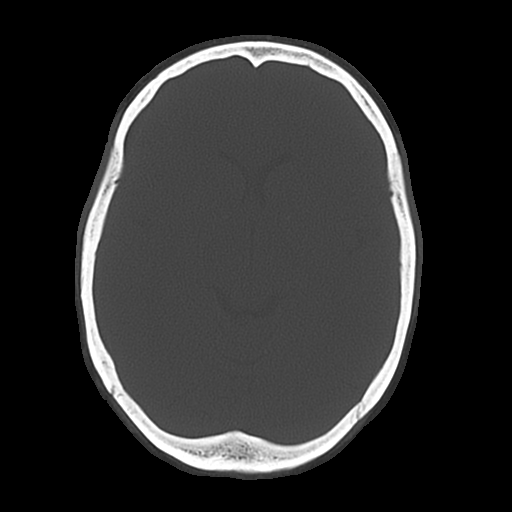

[16 of 30 positions shown; findings below may reference images not displayed]

FINDINGS: No acute intracranial hemorrhage.  No focal mass lesion.
No CT evidence of acute infarction.   No midline shift or mass
effect.  No hydrocephalus.  Basilar cisterns are patent. Paranasal
sinuses and mastoid air cells are clear.  Orbits are normal.
IMPRESSION: No acute intracranial findings.

## 2013-03-09 ENCOUNTER — Other Ambulatory Visit (HOSPITAL_COMMUNITY): Payer: Self-pay | Admitting: Family Medicine

## 2013-03-09 DIAGNOSIS — Z1231 Encounter for screening mammogram for malignant neoplasm of breast: Secondary | ICD-10-CM

## 2013-03-15 ENCOUNTER — Ambulatory Visit (HOSPITAL_COMMUNITY): Payer: Self-pay

## 2013-03-22 ENCOUNTER — Ambulatory Visit (HOSPITAL_COMMUNITY): Payer: Self-pay

## 2013-03-30 ENCOUNTER — Ambulatory Visit (HOSPITAL_COMMUNITY): Payer: Self-pay | Attending: Family Medicine

## 2013-10-12 ENCOUNTER — Ambulatory Visit (HOSPITAL_COMMUNITY): Payer: Self-pay

## 2013-10-13 ENCOUNTER — Ambulatory Visit (HOSPITAL_COMMUNITY): Payer: Self-pay

## 2013-10-24 ENCOUNTER — Ambulatory Visit (HOSPITAL_COMMUNITY)
Admission: RE | Admit: 2013-10-24 | Discharge: 2013-10-24 | Disposition: A | Discharge status: Home / Self Care | Payer: 59 | Source: Ambulatory Visit | Attending: Family Medicine | Admitting: Family Medicine

## 2013-10-24 DIAGNOSIS — Z1231 Encounter for screening mammogram for malignant neoplasm of breast: Secondary | ICD-10-CM | POA: Insufficient documentation

## 2014-02-19 ENCOUNTER — Ambulatory Visit: Payer: Self-pay | Admitting: Family Medicine

## 2014-02-25 ENCOUNTER — Encounter (HOSPITAL_COMMUNITY): Payer: Self-pay | Admitting: Emergency Medicine

## 2014-02-25 ENCOUNTER — Emergency Department (INDEPENDENT_AMBULATORY_CARE_PROVIDER_SITE_OTHER)
Admission: EM | Admit: 2014-02-25 | Discharge: 2014-02-25 | Disposition: A | Discharge status: Home / Self Care | Payer: 59 | Source: Home / Self Care | Attending: Emergency Medicine | Admitting: Emergency Medicine

## 2014-02-25 DIAGNOSIS — J309 Allergic rhinitis, unspecified: Secondary | ICD-10-CM

## 2014-02-25 MED ORDER — CETIRIZINE-PSEUDOEPHEDRINE ER 5-120 MG PO TB12
1.0000 | ORAL_TABLET | Freq: Every day | ORAL | Status: DC
Start: 2014-02-25 — End: 2023-04-26

## 2014-02-25 NOTE — ED Provider Notes (Signed)
CSN: [id number]     Arrival date & time 02/25/14  1111 History   First MD Initiated Contact with Patient 02/25/14 1234     Chief Complaint  Patient presents with  . Rash   (Consider location/radiation/quality/duration/timing/severity/associated sxs/prior Treatment)  HPI  The patient is a 53 year old female presenting today with complaints of "itchy bumps" on her face x one week. In addition, the patient reports "changes in her breathing".  The patient is unable to verbalize exactly how her breathing is different, but says that her breathing "sounds different" for approximately the past 2 days.  She denies any cough, congestion, fever, nausea, vomiting, or diarrhea. States history of seasonal allergies.  Past Medical History  Diagnosis Date  . Acid reflux    History reviewed. No pertinent past surgical history. No family history on file. History  Substance Use Topics  . Smoking status: Never Smoker   . Smokeless tobacco: Not on file  . Alcohol Use: No   OB History   Grav Para Term Preterm Abortions TAB SAB Ect Mult Living                 Review of Systems  Constitutional: Negative.  Negative for fever and fatigue.  HENT: Negative.  Negative for congestion, sinus pressure and sore throat.   Eyes: Negative.   Respiratory: Negative for cough, choking, chest tightness, shortness of breath and wheezing.        States her breathing "sounds different", denies SOB or chest pain.  Cardiovascular: Negative.   Gastrointestinal: Negative.   Endocrine: Negative.   Genitourinary: Negative.   Musculoskeletal: Negative.   Neurological: Negative.   Hematological: Negative.   Psychiatric/Behavioral: Negative.     Allergies  Eggs or egg-derived products  Home Medications   Prior to Admission medications   Medication Sig Start Date End Date Taking? Authorizing Provider  lansoprazole (PREVACID) 30 MG capsule Take 30 mg by mouth daily.      Historical Provider, MD   BP 117/75  Pulse  85  Temp(Src) 97.9 F (36.6 C) (Oral)  Resp 18  SpO2 97%  Physical Exam  Nursing note and vitals reviewed. Constitutional: She appears well-developed and well-nourished. No distress.  HENT:  Head: Normocephalic and atraumatic.  Right Ear: External ear normal.  Left Ear: External ear normal.  Nose: Nose normal.  Mouth/Throat: Oropharynx is clear and moist. No oropharyngeal exudate.  Eyes: Pupils are equal, round, and reactive to light. Right eye exhibits no discharge. Left eye exhibits no discharge. No scleral icterus.  Neck: Normal range of motion. Neck supple. No tracheal deviation present.  Cardiovascular: Normal rate, regular rhythm, normal heart sounds and intact distal pulses.  Exam reveals no gallop and no friction rub.   No murmur heard. Pulmonary/Chest: Effort normal and breath sounds normal. No stridor. No respiratory distress. She has no wheezes. She has no rales. She exhibits no tenderness.  No adventitious breath sounds noted, good air exchange auscultated throughout all lung fields.  Lymphadenopathy:    She has no cervical adenopathy.  Skin: Skin is warm and dry. No rash noted. She is not diaphoretic. No erythema. No pallor.  The patient has a few small scattered pinpoint papules across her for head which she states itch "sometimes".  They do not resemble wheals, but are more consistent with acne.    ED Course  Procedures (including critical care time) Labs Review Labs Reviewed - No data to display  Imaging Review No results found.   MDM   1.  Allergic rhinitis    Meds ordered this encounter  Medications  . cetirizine-pseudoephedrine (ZYRTEC-D) 5-120 MG per tablet    Sig: Take 1 tablet by mouth daily.    Dispense:  15 tablet    Refill:  0   The patient verbalizes understanding and agrees to plan of care.       Weber Cooksatherine Davian Hanshaw, NP 02/25/14 1432

## 2014-02-25 NOTE — ED Notes (Signed)
Patient states her breathing has changed since yesterday Not in any distress at this momement Complains of  "breaking out " on her face that started about a week ago

## 2014-02-25 NOTE — Discharge Instructions (Signed)
Allergic Rhinitis Allergic rhinitis is when the mucous membranes in the nose respond to allergens. Allergens are particles in the air that cause your body to have an allergic reaction. This causes you to release allergic antibodies. Through a chain of events, these eventually cause you to release histamine into the blood stream. Although meant to protect the body, it is this release of histamine that causes your discomfort, such as frequent sneezing, congestion, and an itchy, runny nose.  CAUSES  Seasonal allergic rhinitis (hay fever) is caused by pollen allergens that may come from grasses, trees, and weeds. Year-round allergic rhinitis (perennial allergic rhinitis) is caused by allergens such as house dust mites, pet dander, and mold spores.  SYMPTOMS   Nasal stuffiness (congestion).  Itchy, runny nose with sneezing and tearing of the eyes. DIAGNOSIS  Your health care provider can help you determine the allergen or allergens that trigger your symptoms. If you and your health care provider are unable to determine the allergen, skin or blood testing may be used. TREATMENT  Allergic Rhinitis does not have a cure, but it can be controlled by:  Medicines and allergy shots (immunotherapy).  Avoiding the allergen. Hay fever may often be treated with antihistamines in pill or nasal spray forms. Antihistamines block the effects of histamine. There are over-the-counter medicines that may help with nasal congestion and swelling around the eyes. Check with your health care provider before taking or giving this medicine.  If avoiding the allergen or the medicine prescribed do not work, there are many new medicines your health care provider can prescribe. Stronger medicine may be used if initial measures are ineffective. Desensitizing injections can be used if medicine and avoidance does not work. Desensitization is when a patient is given ongoing shots until the body becomes less sensitive to the allergen.  Make sure you follow up with your health care provider if problems continue. HOME CARE INSTRUCTIONS It is not possible to completely avoid allergens, but you can reduce your symptoms by taking steps to limit your exposure to them. It helps to know exactly what you are allergic to so that you can avoid your specific triggers. SEEK MEDICAL CARE IF:   You have a fever.  You develop a cough that does not stop easily (persistent).  You have shortness of breath.  You start wheezing.  Symptoms interfere with normal daily activities. Document Released: 06/16/2001 Document Revised: 07/12/2013 Document Reviewed: 05/29/2013 Exit[employer] Patient Information 2014 [employer].  

## 2014-02-25 NOTE — ED Provider Notes (Signed)
Medical screening examination/treatment/procedure(s) were performed by non-physician practitioner and as supervising physician I was immediately available for consultation/collaboration.  Leslee Home, M.D.  Reuben Likes, MD 02/25/14 2223

## 2015-01-21 ENCOUNTER — Encounter (HOSPITAL_COMMUNITY): Payer: Self-pay | Admitting: Emergency Medicine

## 2015-01-21 ENCOUNTER — Emergency Department (HOSPITAL_COMMUNITY)
Admission: EM | Admit: 2015-01-21 | Discharge: 2015-01-21 | Discharge status: Against Medical Advice | Payer: Self-pay | Attending: Emergency Medicine | Admitting: Emergency Medicine

## 2015-01-21 DIAGNOSIS — W07XXXA Fall from chair, initial encounter: Secondary | ICD-10-CM | POA: Insufficient documentation

## 2015-01-21 DIAGNOSIS — Y998 Other external cause status: Secondary | ICD-10-CM | POA: Insufficient documentation

## 2015-01-21 DIAGNOSIS — Y9289 Other specified places as the place of occurrence of the external cause: Secondary | ICD-10-CM | POA: Insufficient documentation

## 2015-01-21 DIAGNOSIS — Y9389 Activity, other specified: Secondary | ICD-10-CM | POA: Insufficient documentation

## 2015-01-21 DIAGNOSIS — S0990XA Unspecified injury of head, initial encounter: Secondary | ICD-10-CM | POA: Insufficient documentation

## 2015-01-21 NOTE — ED Notes (Signed)
Pt told Chrissie NoaWilliam NT that she was leaving.  NT was unable to get pt to stay to be seen by doctor.

## 2015-01-21 NOTE — ED Notes (Signed)
Pt states that she just came off 16 hour shift and set in chair for minute to relax and fell asleep. Pt fell out of chair and hit her left side of her head on coffee table.  Pt has small amount of blood on washcloth, bleeding controlled at this time.

## 2015-01-21 NOTE — ED Notes (Signed)
Bed: WBH41 Expected date:  Expected time:  Means of arrival:  Comments: Triage 4 

## 2015-02-22 ENCOUNTER — Emergency Department (INDEPENDENT_AMBULATORY_CARE_PROVIDER_SITE_OTHER)
Admission: EM | Admit: 2015-02-22 | Discharge: 2015-02-22 | Disposition: A | Discharge status: Home / Self Care | Payer: Self-pay | Source: Home / Self Care | Attending: Family Medicine | Admitting: Family Medicine

## 2015-02-22 ENCOUNTER — Encounter (HOSPITAL_COMMUNITY): Payer: Self-pay | Admitting: Emergency Medicine

## 2015-02-22 DIAGNOSIS — R519 Headache, unspecified: Secondary | ICD-10-CM

## 2015-02-22 DIAGNOSIS — R51 Headache: Secondary | ICD-10-CM

## 2015-02-22 MED ORDER — ACETAMINOPHEN 500 MG PO TABS: 500.0000 mg | ORAL_TABLET | Freq: Four times a day (QID) | ORAL | Status: AC | PRN

## 2015-02-22 NOTE — ED Provider Notes (Signed)
CSN: [id number]     Arrival date & time 02/22/15  [id number] History   First MD Initiated Contact with Patient 02/22/15 [phone]     Chief Complaint  Patient presents with  . Headache    HPI   54 year old female known history of rhinitis and other issues.  [facility] ago where she fell out of her chair after 16 hour shift hit the side of her head on the left side and sustained some bleeding. She left after not being seen at the emergency room as a wait was too long. 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 Acid reflux    History reviewed. No pertinent past surgical history. No family history on file. History  Substance Use Topics  . Smoking status: Never Smoker   . Smokeless tobacco: Not on file  . Alcohol Use: No   OB History    No data available     Review of Systems  See above   Allergies  Eggs or egg-derived products  Home Medications   Prior to Admission medications   Medication Sig Start Date End Date Taking? Authorizing Provider  cetirizine-pseudoephedrine (ZYRTEC-D) 5-120 MG per  tablet Take 1 tablet by mouth daily. 02/25/14   Servando Salinaatherine H Rossi, NP  lansoprazole (PREVACID) 30 MG capsule Take 30 mg by mouth daily.      Historical Provider, MD   BP 123/79 mmHg  Pulse 68  Temp(Src) 97.9 F (36.6 C) (Oral)  Resp 16  SpO2 97%  LMP 01/23/2015 Physical Exam   Pleasant oriented anxious nad Eomi,ncat Head is atraumatic with a small bump on the L head Sinuses are non tender No nasal discharge   chest clear S1-S2 no murmur rub or gallop  ED Course  Procedures (including critical care time) Labs Review Labs Reviewed - No data to display  Imaging Review No results found.   MDM  No diagnosis found. Simple headache, possible post concussion-I do not feel she is at high risk for subdural hematoma nor intracranial bleed and she does not need any further imaging-her headache is relieved by rest She does not need any narcotics or any significant monitoring at this stage She does have primary care physician Dr. Cliffton AstersWhite and if she has further concerns may get a non-emergent CT scan of the head subsequently although I sincerely feel it's not indicated She's been recommended to take Tylenol OTC round-the-clock for one to 2 days cc this helps and follow-up with PCP as an outpatient  Pleas KochJai Alanys Godino, MD [facility] [phone]) [phone]     Rhetta MuraJai-Gurmukh Cashton Hosley, MD 02/22/15 [phone]
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
## 2016-04-18 ENCOUNTER — Encounter (HOSPITAL_COMMUNITY): Payer: Self-pay | Admitting: *Deleted

## 2016-04-18 ENCOUNTER — Ambulatory Visit (HOSPITAL_COMMUNITY)
Admission: EM | Admit: 2016-04-18 | Discharge: 2016-04-18 | Disposition: A | Discharge status: Home / Self Care | Payer: Self-pay | Attending: Emergency Medicine | Admitting: Emergency Medicine

## 2016-04-18 DIAGNOSIS — T754XXA Electrocution, initial encounter: Secondary | ICD-10-CM
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
## 2016-06-19 LAB — GLUCOSE, POCT (MANUAL RESULT ENTRY): POC GLUCOSE: 105 mg/dL — AB (ref 70–99)

## 2016-09-03 ENCOUNTER — Ambulatory Visit: Payer: Self-pay | Attending: Family Medicine | Admitting: Family Medicine

## 2016-09-03 ENCOUNTER — Encounter: Payer: Self-pay | Admitting: Family Medicine

## 2016-09-03 VITALS — BP 111/70 | HR 78 | Temp 97.7°F | Resp 16 | Ht 64.0 in | Wt 116.0 lb

## 2016-09-03 DIAGNOSIS — B070 Plantar wart: Secondary | ICD-10-CM | POA: Insufficient documentation

## 2016-09-03 DIAGNOSIS — K219 Gastro-esophageal reflux disease without esophagitis: Secondary | ICD-10-CM | POA: Insufficient documentation

## 2016-09-03 DIAGNOSIS — Z0000 Encounter for general adult medical examination without abnormal findings: Secondary | ICD-10-CM | POA: Insufficient documentation

## 2016-09-03 NOTE — Progress Notes (Signed)
Subjective:   Patient ID: Kathy Gilbert, female    DOB: Jul 04, 1961, 55 y.o.   MRN: [id number]  Chief Complaint  Patient presents with  . Annual Exam   HPI Kathy Gilbert 55 y.o. female comes to the office for physical exam and right foot complaint. She denies any CP or SOB. She denies any swelling of the extremities. She denies any lightheadedness or dizziness. She denies any vision changes. She denies any changes in bowel or bladder habits. She denies any pain. She is requesting shingles vaccination and colonoscopy referral. She has complaints of an re-occurring lesion to the right posterior foot. She says she has attempted to soak the area and  "file it down" in the past but it's ineffective.   Past Medical History:  Diagnosis Date  . Acid reflux     Social History   Social History  . Marital status: Single    Spouse name: N/A  . Number of children: N/A  . Years of education: N/A   Occupational History  . Not on file.   Social History Main Topics  . Smoking status: Never Smoker  . Smokeless tobacco: Not on file  . Alcohol use No  . Drug use: No  . Sexual activity: No   Other Topics Concern  . Not on file   Social History Narrative  . No narrative on file    Outpatient Medications Prior to Visit  Medication Sig Dispense Refill  . lansoprazole (PREVACID) 30 MG capsule Take 30 mg by mouth daily.      Marland Kitchen. acetaminophen (TYLENOL) 500 MG tablet Take 1 tablet (500 mg total) by mouth every 6 (six) hours as needed. (Patient not taking: Reported on 09/03/2016) 30 tablet 0  . cetirizine-pseudoephedrine (ZYRTEC-D) 5-120 MG per tablet Take 1 tablet by mouth daily. (Patient not taking: Reported on 09/03/2016) 15 tablet 0   No facility-administered medications prior to visit.     Allergies  Allergen Reactions  . Eggs Or Egg-Derived Products     Review of Systems  Constitutional: Negative.   HENT: Negative.   Eyes: Negative.   Respiratory: Negative.   Cardiovascular:  Negative.   Gastrointestinal: Negative.   Genitourinary: Negative.   Musculoskeletal: Negative.   Skin:       Pt.reports lesion on right posterior foot.  Neurological: Negative.   Psychiatric/Behavioral: Negative.       Objective:    Physical Exam  Constitutional: She is oriented to person, place, and time. She appears well-developed and well-nourished.  Eyes: Conjunctivae and EOM are normal. Pupils are equal, round, and reactive to light. Right eye exhibits no discharge. Left eye exhibits no discharge. No scleral icterus.  Neck: Normal range of motion. Neck supple. No JVD present. No tracheal deviation present. No thyromegaly present.  Cardiovascular: Normal rate, regular rhythm, normal heart sounds and intact distal pulses.   Pulmonary/Chest: Effort normal and breath sounds normal.  Abdominal: Soft. Bowel sounds are normal. She exhibits no mass. There is no tenderness.  Musculoskeletal: Normal range of motion. She exhibits no edema or tenderness.  Lymphadenopathy:    She has no cervical adenopathy.  Neurological: She is alert and oriented to person, place, and time.  Skin: Skin is warm and dry. Lesion (Plantar warts noted to right posterior foot) noted.  Psychiatric: She has a normal mood and affect. Her behavior is normal. Thought content normal.    BP 111/70   Pulse 78   Temp 97.7 F (36.5 C) (Oral)   Resp 16  Ht 5\' 4"  (1.626 m)   Wt 116 lb (52.6 kg)   LMP 08/06/2015   SpO2 100%   BMI 19.91 kg/m  Wt Readings from Last 3 Encounters:  09/03/16 116 lb (52.6 kg)  06/20/09 123 lb (55.8 kg)  02/12/09 122 lb (55.3 kg)    Immunization History  Administered Date(s) Administered  . Td 10/23/2005    Lab Results  Component Value Date   TSH 1.49 09/03/2016   Lab Results  Component Value Date   WBC 4.8 09/03/2016   HGB 12.9 09/03/2016   HCT 39.2 09/03/2016   MCV 89.7 09/03/2016   PLT 214 09/03/2016   Lab Results  Component Value Date   NA 139 09/03/2016   K 4.3  09/03/2016   CO2 27 09/03/2016   GLUCOSE 90 09/03/2016   BUN 13 09/03/2016   CREATININE 0.74 09/03/2016   BILITOT 0.9 11/30/2008   ALKPHOS 67 11/30/2008   AST 17 11/30/2008   ALT 26 11/30/2008   PROT 7.5 11/30/2008   ALBUMIN 4.5 11/30/2008   CALCIUM 9.1 09/03/2016   No results found for: CHOL No results found for: HDL No results found for: LDLCALC No results found for: TRIG No results found for: CHOLHDL Lab Results  Component Value Date   HGBA1C 5.6 09/03/2016       Assessment & Plan:   Problem List Items Addressed This Visit    None    Visit Diagnoses    Physical exam, annual    -  Primary   Relevant Orders   Ambulatory referral to Gastroenterology   MM Digital Screening   TSH (Completed)   Vitamin D, 25-hydroxy (Completed)   Basic Metabolic Panel (Completed)   CBC with Differential (Completed)   Hepatitis C Antibody (Completed)   Hemoglobin A1c (Completed)   Varicella-zoster vaccine subcutaneous   Lipid Panel   Plantar warts       Relevant Orders   Ambulatory referral to Dermatology      No new medication orders were placed during this encounter.  Follow up: 1 year   Arrie SenateMandesia Ausha Sieh, FNP

## 2016-09-03 NOTE — Patient Instructions (Addendum)
Plantar Warts Introduction Plantar warts are small growths on the bottom of the foot (sole). Warts are caused by a type of germ (virus). Most warts are not painful, and they usually do not cause problems. Sometimes, plantar warts can cause pain when you walk. Warts often go away on their own in time. Treatments may be done if needed. Follow these instructions at home: General instructions  Apply creams or solutions only as told by your doctor. Follow these steps if your doctor tells you to do so:  Soak your foot in warm water.  Remove the top layer of softened skin before you apply the medicine. You can use a pumice stone to remove the tissue.  After you apply the medicine, put a bandage over the area of the wart.  Repeat the process every day or as told by your doctor.  Do not scratch or pick at a wart.  Wash your hands after you touch a wart.  If a wart is painful, try putting a bandage with a hole in the middle over the wart.  Keep all follow-up visits as told by your doctor. This is important. Prevention  Wear shoes and socks. Change socks every day.  Keep your feet clean and dry.  Check your feet often.  Avoid direct contact with warts on other people. Contact a doctor if:  Your warts do not improve after treatment.  You have redness, swelling, or pain at the site of a wart.  You have bleeding from a wart, and the bleeding does not stop when you put light pressure on the wart.  You have diabetes and you get a wart. This information is not intended to replace advice given to you by your health care provider. Make sure you discuss any questions you have with your health care provider. Document Released: 10/24/2010 Document Revised: 02/27/2016 Document Reviewed: 12/17/2014  2017 Elsevier  

## 2016-09-03 NOTE — Progress Notes (Signed)
Patient has eaten today. Referral for colonoscopy. Rx for shingles vaccine.

## 2016-09-04 LAB — BASIC METABOLIC PANEL
BUN: 13 mg/dL (ref 7–25)
CALCIUM: 9.1 mg/dL (ref 8.6–10.4)
CHLORIDE: 104 mmol/L (ref 98–110)
CO2: 27 mmol/L (ref 20–31)
CREATININE: 0.74 mg/dL (ref 0.50–1.05)
GLUCOSE: 90 mg/dL (ref 65–99)
Potassium: 4.3 mmol/L (ref 3.5–5.3)
SODIUM: 139 mmol/L (ref 135–146)

## 2016-09-04 LAB — VITAMIN D 25 HYDROXY (VIT D DEFICIENCY, FRACTURES): VIT D 25 HYDROXY: 13 ng/mL — AB (ref 30–100)

## 2016-09-04 LAB — CBC WITH DIFFERENTIAL/PLATELET
BASOS ABS: 0 {cells}/uL (ref 0–200)
Basophils Relative: 0 %
EOS ABS: 96 {cells}/uL (ref 15–500)
EOS PCT: 2 %
HEMATOCRIT: 39.2 % (ref 35.0–45.0)
HEMOGLOBIN: 12.9 g/dL (ref 11.7–15.5)
LYMPHS ABS: 1920 {cells}/uL (ref 850–3900)
Lymphocytes Relative: 40 %
MCH: 29.5 pg (ref 27.0–33.0)
MCHC: 32.9 g/dL (ref 32.0–36.0)
MCV: 89.7 fL (ref 80.0–100.0)
MPV: 10.3 fL (ref 7.5–12.5)
Monocytes Absolute: 336 cells/uL (ref 200–950)
Monocytes Relative: 7 %
NEUTROS ABS: 2448 {cells}/uL (ref 1500–7800)
NEUTROS PCT: 51 %
Platelets: 214 10*3/uL (ref 140–400)
RBC: 4.37 MIL/uL (ref 3.80–5.10)
RDW: 13.9 % (ref 11.0–15.0)
WBC: 4.8 10*3/uL (ref 3.8–10.8)

## 2016-09-04 LAB — TSH: TSH: 1.49 mIU/L

## 2016-09-04 LAB — HEMOGLOBIN A1C
Hgb A1c MFr Bld: 5.6 % (ref <5.7)
Mean Plasma Glucose: 114 mg/dL

## 2016-09-04 LAB — HEPATITIS C ANTIBODY: HCV Ab: NEGATIVE

## 2016-09-07 ENCOUNTER — Other Ambulatory Visit: Payer: Self-pay | Admitting: Family Medicine

## 2016-09-07 DIAGNOSIS — E559 Vitamin D deficiency, unspecified: Secondary | ICD-10-CM | POA: Insufficient documentation

## 2016-09-07 MED ORDER — ERGOCALCIFEROL 1.25 MG (50000 UT) PO CAPS: 50000.0000 [IU] | ORAL_CAPSULE | ORAL | 0 refills | Status: AC

## 2016-09-08 ENCOUNTER — Telehealth: Payer: Self-pay

## 2016-09-08 NOTE — Telephone Encounter (Addendum)
Phone rung x 2, then disconnected.  Attempted to advise:  Vitamin D level was low. Vitamin D helps to keep bones strong. You were prescribed ergocalciferol (capsules) to increase your vitamin-d level. Once finished start taking OTC vitamin d supplement with 800 international units (IU) of vitamin-d per day.  -Please come back in 4 months to have vitamin d level checked in office.   -Hepatitis C was negative. -HgbA1c is 5.6 . HgbA1c gives an average of your blood sugar levels over the last 3 months. A HgbA1c of 5.7 -6.4 is considered pre-diabetes. -Eat a balanced diet and exercise 3 to 5 days/wk for at least 30 mins to reduce risk for diabetes.

## 2016-09-11 ENCOUNTER — Telehealth: Payer: Self-pay

## 2016-09-11 NOTE — Telephone Encounter (Signed)
-----   Message from Lizbeth BarkMandesia R Hairston, FNP sent at 09/07/2016  1:16 PM EST ----- -Vitamin D level was low. Vitamin D helps to keep bones strong. You were prescribed ergocalciferol (capsules) to increase your vitamin-d level. Once finished start taking OTC vitamin d supplement with 800 international units (IU) of vitamin-d per day.  -Please come back in 4 months to have vitamin d level checked in office.

## 2016-09-11 NOTE — Telephone Encounter (Addendum)
Letter generated-------- -Hepatitis C was negative. -HgbA1c is 5.6 . HgbA1c gives an average of your blood sugar levels over the last 3 months. A HgbA1c of 5.7 -6.4 is considered pre-diabetes. -Eat a balanced diet and exercise 3 to 5 days/wk for at least 30 mins to reduce risk for diabetes.

## 2016-09-21 ENCOUNTER — Other Ambulatory Visit: Payer: Self-pay | Admitting: Family Medicine

## 2016-09-21 DIAGNOSIS — Z1231 Encounter for screening mammogram for malignant neoplasm of breast: Secondary | ICD-10-CM

## 2016-09-22 ENCOUNTER — Telehealth: Payer: Self-pay

## 2016-09-22 NOTE — Telephone Encounter (Signed)
Return letter received 09/21/2016: Attempted to advise patient of recent lab results.  Call placed to patient at [phone] phone rung greater than five times no answer/no voicemail option.

## 2016-12-19 DIAGNOSIS — L03116 Cellulitis of left lower limb: Secondary | ICD-10-CM | POA: Insufficient documentation

## 2016-12-19 DIAGNOSIS — Z79899 Other long term (current) drug therapy: Secondary | ICD-10-CM | POA: Insufficient documentation

## 2016-12-20 ENCOUNTER — Encounter (HOSPITAL_COMMUNITY): Payer: Self-pay

## 2016-12-20 ENCOUNTER — Emergency Department (HOSPITAL_COMMUNITY)
Admission: EM | Admit: 2016-12-20 | Discharge: 2016-12-20 | Disposition: A | Discharge status: Home / Self Care | Payer: Self-pay | Attending: Emergency Medicine | Admitting: Emergency Medicine

## 2016-12-20 DIAGNOSIS — L03116 Cellulitis of left lower limb: Secondary | ICD-10-CM

## 2016-12-20 MED ORDER — CLINDAMYCIN HCL 300 MG PO CAPS
300.0000 mg | ORAL_CAPSULE | Freq: Once | ORAL | Status: AC
  Administered 2016-12-20: 300 mg via ORAL
  Filled 2016-12-20: qty 1

## 2016-12-20 MED ORDER — CLINDAMYCIN HCL 150 MG PO CAPS: 300.0000 mg | ORAL_CAPSULE | Freq: Three times a day (TID) | ORAL | 0 refills | Status: DC

## 2016-12-20 NOTE — ED Triage Notes (Signed)
Pt states that she woke up and her L foot was swollen and painful. Denies any recent injury. A&Ox4.

## 2016-12-20 NOTE — Discharge Instructions (Signed)
Take the prescribed medication as directed.  Recommend to elevate your foot at home to help with swelling. Raise above the level of the heart. Follow-up with your primary care doctor on Monday for recheck. Return to the ED for new or worsening symptoms.

## 2016-12-20 NOTE — ED Provider Notes (Signed)
[facility]-EMERGENCY DEPT Provider Note   CSN: [id number] Arrival date & time: 12/19/16  2359     History   Chief Complaint Chief Complaint  Patient presents with  . Foot Pain    HPI Kathy Gilbert is a 56 y.o. female.  The history is provided by the patient and medical records.    56 year old female here with left foot pain. Reports she noticed this today while she was working. States her foot has been aching throughout the day. She denies any injury, trauma, or falls. States she thought her foot looked a little red earlier, but figured it was from her socks. She denies any fever or chills. No calf pain or leg pain. She has no history of diabetes. No history of DVT or PE. No chest pain or shortness of breath.  States she has a physical earlier this month without any acute findings.  Took motrin for pain earlier which has helped.  Past Medical History:  Diagnosis Date  . Acid reflux     Patient Active Problem List   Diagnosis Date Noted  . Vitamin D deficiency 09/07/2016  . NECK AND BACK PAIN 06/20/2009  . PARANOIA 11/30/2008  . Headache above the eye region 11/30/2008  . HYPOTENSION 10/11/2008  . UPPER RESPIRATORY INFECTION 10/03/2008  . EDEMA 10/03/2008  . VISUAL ACUITY, DECREASED 06/20/2008  . GERD 06/20/2008  . HEMORRHOIDS, INTERNAL 04/17/2004  . FIBROCYSTIC BREAST DISEASE 10/31/1997    History reviewed. No pertinent surgical history.  OB History    No data available       Home Medications    Prior to Admission medications   Medication Sig Start Date End Date Taking? Authorizing Provider  acetaminophen (TYLENOL) 500 MG tablet Take 1 tablet (500 mg total) by mouth every 6 (six) hours as needed. Patient not taking: Reported on 09/03/2016 02/22/15   Rhetta MuraJai-Gurmukh Samtani, MD  cetirizine-pseudoephedrine (ZYRTEC-D) 5-120 MG per tablet Take 1 tablet by mouth daily. Patient not taking: Reported on 09/03/2016 02/25/14   Servando Salinaatherine H Rossi, NP  lansoprazole (PREVACID) 30 MG  capsule Take 30 mg by mouth daily.      Historical Provider, MD    Family History History reviewed. No pertinent family history.  Social History Social History  Substance Use Topics  . Smoking status: Never Smoker  . Smokeless tobacco: Not on file  . Alcohol use No     Allergies   Eggs or egg-derived products   Review of Systems Review of Systems  Musculoskeletal: Positive for arthralgias and joint swelling.  All other systems reviewed and are negative.    Physical Exam Updated Vital Signs BP 131/73 (BP Location: Right Arm)   Pulse 97   Temp 98.4 F (36.9 C) (Oral)   Resp 16   SpO2 98%   Physical Exam  Constitutional: She is oriented to person, place, and time. She appears well-developed and well-nourished.  HENT:  Head: Normocephalic and atraumatic.  Mouth/Throat: Oropharynx is clear and moist.  Eyes: Conjunctivae and EOM are normal. Pupils are equal, round, and reactive to light.  Neck: Normal range of motion.  Cardiovascular: Normal rate, regular rhythm and normal heart sounds.   Pulmonary/Chest: Effort normal and breath sounds normal. No respiratory distress. She has no wheezes.  Abdominal: Soft. Bowel sounds are normal. There is no tenderness. There is no rebound.  Musculoskeletal: Normal range of motion.  Left foot is diffusely swollen along the dorsal aspect; skin is dry and cracked along the sole; no open wounds or sores;  there is some erythema and induration along the proximal left foot extending to the medial and lateral left ankle; warmth to touch noted; DP pulse intact; moving all toes normally; compartments soft, no tissue crepitus No calf swelling or tenderness, no lymphangitis or streaking of the leg; no palpable cords, negative homan's sign Right foot appears normal  Neurological: She is alert and oriented to person, place, and time.  Skin: Skin is warm and dry.  Psychiatric: She has a normal mood and affect.  Nursing note and vitals  reviewed.    ED Treatments / Results  Labs (all labs ordered are listed, but only abnormal results are displayed) Labs Reviewed - No data to display  EKG  EKG Interpretation None       Radiology No results found.  Procedures Procedures (including critical care time)  Medications Ordered in ED Medications  clindamycin (CLEOCIN) capsule 300 mg (300 mg Oral Given 12/20/16 0026)     Initial Impression / Assessment and Plan / ED Course  I have reviewed the triage vital signs and the nursing notes.  Pertinent labs & imaging results that were available during my care of the patient were reviewed by me and considered in my medical decision making (see chart for details).  56 year old female here with left foot pain and swelling. Noticed some aching pain throughout the day today, but awoke from sleep with worsening pain. Her left foot is diffusely swollen without noted bony deformity. There is some erythema and induration along the proximal left foot extending to the medial and lateral left ankle. Skin in this area is warm to touch. DP pulse remains intact. Comparments soft, no tissue crepitus.  She has no calf pain or tenderness. No palpable cords, negative Homans sign.  Patient is afebrile and nontoxic. Suspect this is an early cellulitis. Lower suspicion for DVT given localized to the foot/ankle and no presence of calf pain/tenderness/swelling, palpable cords, and negative homan's sign.  No hx of DVT or PE.  Patient is not diabetic, no history of MRSA. No hx of CHF.  Denies any CP or SOB.  Will start on course of clindamycin, first dose given here. Encouraged to elevate her feet at home to help with the swelling. We'll have her follow-up with her primary care doctor on Monday for recheck.  Offered pain medication, patient would like to stick with motrin for now.  Discussed plan with patient, she acknowledged understanding and agreed with plan of care.  Return precautions given for new or  worsening symptoms.  Final Clinical Impressions(s) / ED Diagnoses   Final diagnoses:  Cellulitis of left foot    New Prescriptions New Prescriptions   CLINDAMYCIN (CLEOCIN) 150 MG CAPSULE    Take 2 capsules (300 mg total) by mouth 3 (three) times daily. May dispense as 150mg  capsules     Garlon Hatchet, PA-C 12/20/16 0040    Shaune Pollack, MD 12/20/16 1147

## 2016-12-23 ENCOUNTER — Inpatient Hospital Stay: Payer: Self-pay | Admitting: Family Medicine

## 2016-12-24 ENCOUNTER — Ambulatory Visit: Payer: Self-pay | Attending: Family Medicine | Admitting: Family Medicine

## 2016-12-24 VITALS — BP 95/56 | HR 76 | Temp 97.5°F | Resp 18 | Ht 64.0 in | Wt 125.8 lb

## 2016-12-24 DIAGNOSIS — Z5329 Procedure and treatment not carried out because of patient's decision for other reasons: Secondary | ICD-10-CM | POA: Insufficient documentation

## 2016-12-24 DIAGNOSIS — L03116 Cellulitis of left lower limb: Secondary | ICD-10-CM | POA: Insufficient documentation

## 2016-12-24 DIAGNOSIS — Z09 Encounter for follow-up examination after completed treatment for conditions other than malignant neoplasm: Secondary | ICD-10-CM

## 2016-12-24 NOTE — Patient Instructions (Signed)
Cellulitis, Adult Cellulitis is a skin infection. The infected area is usually red and sore. This condition occurs most often in the arms and lower legs. It is very important to get treated for this condition. Follow these instructions at home:  Take over-the-counter and prescription medicines only as told by your doctor.  If you were prescribed an antibiotic medicine, take it as told by your doctor. Do not stop taking the antibiotic even if you start to feel better.  Drink enough fluid to keep your pee (urine) clear or pale yellow.  Do not touch or rub the infected area.  Raise (elevate) the infected area above the level of your heart while you are sitting or lying down.  Place warm or cold wet cloths (warm or cold compresses) on the infected area. Do this as told by your doctor.  Keep all follow-up visits as told by your doctor. This is important. These visits let your doctor make sure your infection is not getting worse. Contact a doctor if:  You have a fever.  Your symptoms do not get better after 1-2 days of treatment.  Your bone or joint under the infected area starts to hurt after the skin has healed.  Your infection comes back. This can happen in the same area or another area.  You have a swollen bump in the infected area.  You have new symptoms.  You feel ill and also have muscle aches and pains. Get help right away if:  Your symptoms get worse.  You feel very sleepy.  You throw up (vomit) or have watery poop (diarrhea) for a long time.  There are red streaks coming from the infected area.  Your red area gets larger.  Your red area turns darker. This information is not intended to replace advice given to you by your health care provider. Make sure you discuss any questions you have with your health care provider. Document Released: 03/09/2008 Document Revised: 02/27/2016 Document Reviewed: 07/31/2015 Elsevier Interactive Patient Education  2017 Elsevier  Inc.  

## 2016-12-24 NOTE — Progress Notes (Signed)
Patient is here for F/up   Patient denies pain for today  Patient have taking her meds for today   Patient have eaten for today

## 2016-12-24 NOTE — Progress Notes (Signed)
   Subjective:  Patient ID: Kathy Gilbert Rowton, female    DOB: [birth year]  Age: 56 y.o. MRN: [id number]  CC: Establish Care   HPI Kathy Gilbert Caliendo presents for   F/u left foot cellulitus: She reports decreased swelling with ABT use. Pain 3/10. Reports she stopped taking 300 mg TID and begin taking 150 mg TID on Tuesday due to feeling SOB the night before. She reports no other episodes of SOB since. Denies any hives, swelling, or itching.   Outpatient Medications Prior to Visit  Medication Sig Dispense Refill  . clindamycin (CLEOCIN) 150 MG capsule Take 2 capsules (300 mg total) by mouth 3 (three) times daily. May dispense as 150mg  capsules 60 capsule 0  . acetaminophen (TYLENOL) 500 MG tablet Take 1 tablet (500 mg total) by mouth every 6 (six) hours as needed. (Patient not taking: Reported on 09/03/2016) 30 tablet 0  . cetirizine-pseudoephedrine (ZYRTEC-D) 5-120 MG per tablet Take 1 tablet by mouth daily. (Patient not taking: Reported on 09/03/2016) 15 tablet 0  . lansoprazole (PREVACID) 30 MG capsule Take 30 mg by mouth daily.       No facility-administered medications prior to visit.     ROS Review of Systems  Respiratory: Negative.   Cardiovascular: Negative.   Gastrointestinal: Negative.   Musculoskeletal: Negative.   Skin: Negative.    Objective:  BP (!) 95/56 (BP Location: Left Arm, Patient Position: Sitting, Cuff Size: Normal)   Pulse 76   Temp 97.5 F (36.4 C) (Oral)   Resp 18   Ht 5\' 4"  (1.626 m)   Wt 125 lb 12.8 oz (57.1 kg)   SpO2 100%   BMI 21.59 kg/m   BP/Weight 12/24/2016 12/20/2016 09/03/2016  Systolic BP 95 131 111  Diastolic BP 56 73 70  Wt. (Lbs) 125.8 - 116  BMI 21.59 - 19.91     Physical Exam  Cardiovascular: Normal rate, regular rhythm, normal heart sounds and intact distal pulses.   Pulmonary/Chest: Effort normal and breath sounds normal.  Skin: Skin is warm and dry.     Nursing note and vitals reviewed.  Assessment & Plan:   Problem List Items  Addressed This Visit    None    Visit Diagnoses    Cellulitis of left lower extremity    -  Primary    -Recommended alternative antibiotic therapy but patient refused.     -Patient was given strict follow up precautions if symptoms do not improve.   Follow up          Follow-up: Return in about 2 weeks (around 01/07/2017), or if symptoms worsen or fail to improve.   Lizbeth BarkMandesia R Halei Hanover FNP

## 2017-01-07 ENCOUNTER — Ambulatory Visit: Payer: Self-pay | Admitting: Family Medicine

## 2017-01-07 NOTE — Progress Notes (Deleted)
   Subjective:  Patient ID: Kathy Gilbert, female    DOB: [birth year]  Age: 56 y.o. MRN: [id number]  CC: No chief complaint on file.   HPI [name] presents for    F/u left foot cellulitus:  At last office visit she reports reducing the dosage or her antibiotic due to suspected side effect. She denied any side effects since dosage decrease. She was not agreeable to alternate antibiotic therapy.    Outpatient Medications Prior to Visit  Medication Sig Dispense Refill  . acetaminophen (TYLENOL) 500 MG tablet Take 1 tablet (500 mg total) by mouth every 6 (six) hours as needed. (Patient not taking: Reported on 09/03/2016) 30 tablet 0  . cetirizine-pseudoephedrine (ZYRTEC-D) 5-120 MG per tablet Take 1 tablet by mouth daily. (Patient not taking: Reported on 09/03/2016) 15 tablet 0  . clindamycin (CLEOCIN) 150 MG capsule Take 2 capsules (300 mg total) by mouth 3 (three) times daily. May dispense as  capsules 60 capsule 0  . lansoprazole (PREVACID) 30 MG capsule Take 30 mg by mouth daily.       No facility-administered medications prior to visit.     ROS Review of Systems      Objective:  There were no vitals taken for this visit.  BP/Weight 12/24/2016 12/20/2016 09/03/2016  Systolic BP 95 131 111  Diastolic BP 56 73 70  Wt. (Lbs) 125.8 - 116  BMI 21.59 - 19.91     Physical Exam   Assessment & Plan:   Problem List Items Addressed This Visit    None      No orders of the defined types were placed in this encounter.   Follow-up: No Follow-up on file.   Lizbeth Bark FNP

## 2017-02-05 ENCOUNTER — Ambulatory Visit: Payer: Self-pay | Admitting: Family Medicine

## 2017-02-09 ENCOUNTER — Ambulatory Visit: Payer: Self-pay | Admitting: Family Medicine

## 2017-02-15 ENCOUNTER — Ambulatory Visit: Payer: Self-pay | Admitting: Internal Medicine

## 2017-02-19 ENCOUNTER — Ambulatory Visit: Payer: Self-pay | Attending: Family Medicine | Admitting: Internal Medicine

## 2017-02-19 ENCOUNTER — Encounter: Payer: Self-pay | Admitting: Internal Medicine

## 2017-02-19 VITALS — BP 116/74 | HR 70 | Temp 97.4°F | Resp 16 | Ht 64.0 in | Wt 122.8 lb

## 2017-02-19 DIAGNOSIS — L03116 Cellulitis of left lower limb: Secondary | ICD-10-CM | POA: Insufficient documentation

## 2017-02-19 DIAGNOSIS — L819 Disorder of pigmentation, unspecified: Secondary | ICD-10-CM

## 2017-02-19 DIAGNOSIS — K219 Gastro-esophageal reflux disease without esophagitis: Secondary | ICD-10-CM | POA: Insufficient documentation

## 2017-02-19 NOTE — Progress Notes (Signed)
   Patient ID: Kathy Gilbert, female    DOB: November 20, 1960  MRN: [id number]  CC: Follow-up of this cellulitis of the left foot Subjective:  Kathy Gilbert is a 56 y.o. female who presents for UC visit. Her concerns today include:  Patient presents for recheck of the left foot. She was last seen in March and had completed antibiotics. -Reports that swelling and redness have completely resolved. Still has some discoloration of the skin.  She was referred to Derm back in November for a wart on her foot.  She has completed her application for the [employer] the colon discount and has the applications with her today.  Patient Active Problem List   Diagnosis Date Noted  . Vitamin D deficiency 09/07/2016  . NECK AND BACK PAIN 06/20/2009  . PARANOIA 11/30/2008  . Headache above the eye region 11/30/2008  . HYPOTENSION 10/11/2008  . UPPER RESPIRATORY INFECTION 10/03/2008  . EDEMA 10/03/2008  . VISUAL ACUITY, DECREASED 06/20/2008  . GERD 06/20/2008  . HEMORRHOIDS, INTERNAL 04/17/2004  . FIBROCYSTIC BREAST DISEASE 10/31/1997     Current Outpatient Prescriptions on File Prior to Visit  Medication Sig Dispense Refill  . acetaminophen (TYLENOL) 500 MG tablet Take 1 tablet (500 mg total) by mouth every 6 (six) hours as needed. 30 tablet 0  . cetirizine-pseudoephedrine (ZYRTEC-D) 5-120 MG per tablet Take 1 tablet by mouth daily. 15 tablet 0  . lansoprazole (PREVACID) 30 MG capsule Take 30 mg by mouth daily.       No current facility-administered medications on file prior to visit.     Allergies  Allergen Reactions  . Eggs Or Egg-Derived Products      ROS: Review of Systems   PHYSICAL EXAM: BP 116/74 (BP Location: Left Arm, Patient Position: Sitting, Cuff Size: Large)   Pulse 70   Temp 97.4 F (36.3 C) (Oral)   Resp 16   Ht 5\' 4"  (1.626 m)   Wt 122 lb 12.8 oz (55.7 kg)   LMP 04/21/2016 (Approximate)   SpO2 100%   BMI 21.08 kg/m   Physical Exam  Constitutional: She  appears well-developed. No distress.  Skin:  Left foot: No edema or erythema at this time. Slight hyperpigmentation compared to the right. DP and PT pulses are 3+.    ASSESSMENT AND PLAN: 1. Discoloration of skin of foot -Resolved cellulitis of the left foot.  Patient sent to financial counselor today [facility].  Patient was given the opportunity to ask questions.  Patient verbalized understanding of the plan and was able to repeat key elements of the plan.   No orders of the defined types were placed in this encounter.    Requested Prescriptions    No prescriptions requested or ordered in this encounter    No future appointments.  Jonah Blueeborah Johnson, MD, FACP

## 2017-03-23 ENCOUNTER — Ambulatory Visit: Payer: Self-pay | Attending: Internal Medicine

## 2017-06-10 ENCOUNTER — Ambulatory Visit: Payer: Self-pay | Attending: Internal Medicine | Admitting: Internal Medicine

## 2017-06-10 ENCOUNTER — Encounter: Payer: Self-pay | Admitting: Internal Medicine

## 2017-06-10 VITALS — BP 114/79 | HR 73 | Temp 97.8°F | Resp 16 | Wt 120.0 lb

## 2017-06-10 DIAGNOSIS — Z1211 Encounter for screening for malignant neoplasm of colon: Secondary | ICD-10-CM

## 2017-06-10 DIAGNOSIS — B070 Plantar wart: Secondary | ICD-10-CM | POA: Insufficient documentation

## 2017-06-10 DIAGNOSIS — E559 Vitamin D deficiency, unspecified: Secondary | ICD-10-CM | POA: Insufficient documentation

## 2017-06-10 DIAGNOSIS — Z7689 Persons encountering health services in other specified circumstances: Secondary | ICD-10-CM | POA: Insufficient documentation

## 2017-06-10 DIAGNOSIS — Z2821 Immunization not carried out because of patient refusal: Secondary | ICD-10-CM

## 2017-06-10 NOTE — Progress Notes (Signed)
    Patient ID: Kathy Gilbert, female    DOB: 11-08-1960  MRN: [id number]  CC: Referral   Subjective: Kathy Gilbert is a 56 y.o. female who presents for UC visit Her concerns today include:  Pt requesting referral to colonoscopy for cancer screen. -moving bowels ok. No blood in stools.  No fhx of colon CA  Also request referral to derm for plantar wart RT foot. Tried OTC wart remover, did not help that much. -she is in the process of applying for [facility] discount  Patient Active Problem List   Diagnosis Date Noted  . Vitamin D deficiency 09/07/2016  . NECK AND BACK PAIN 06/20/2009  . PARANOIA 11/30/2008  . Headache above the eye region 11/30/2008  . EDEMA 10/03/2008  . VISUAL ACUITY, DECREASED 06/20/2008  . GERD 06/20/2008  . HEMORRHOIDS, INTERNAL 04/17/2004  . FIBROCYSTIC BREAST DISEASE 10/31/1997     Current Outpatient Prescriptions on File Prior to Visit  Medication Sig Dispense Refill  . acetaminophen (TYLENOL) 500 MG tablet Take 1 tablet (500 mg total) by mouth every 6 (six) hours as needed. 30 tablet 0  . cetirizine-pseudoephedrine (ZYRTEC-D) 5-120 MG per tablet Take 1 tablet by mouth daily. 15 tablet 0  . lansoprazole (PREVACID) 30 MG capsule Take 30 mg by mouth daily.       No current facility-administered medications on file prior to visit.     Allergies  Allergen Reactions  . Eggs Or Egg-Derived Products     Social History   Social History  . Marital status: Single    Spouse name: N/A  . Number of children: N/A  . Years of education: N/A   Occupational History  . Not on file.   Social History Main Topics  . Smoking status: Never Smoker  . Smokeless tobacco: Never Used  . Alcohol use No  . Drug use: No  . Sexual activity: No   Other Topics Concern  . Not on file   Social History Narrative  . No narrative on file    No family history on file.  No past surgical history on file.  ROS: Review of Systems Negative except as stated  above  PHYSICAL EXAM: BP 114/79   Pulse 73   Temp 97.8 F (36.6 C) (Oral)   Resp 16   Wt 120 lb (54.4 kg)   SpO2 95%   BMI 20.60 kg/m   Physical Exam General appearance - alert, well appearing, and in no distress Skin: small wart plantar surface RT foot  ASSESSMENT AND PLAN: 1. Colon cancer screening - Ambulatory referral to Gastroenterology  2. Plantar warts - Ambulatory referral to Dermatology  3. Influenza vaccination declined   Patient was given the opportunity to ask questions.  Patient verbalized understanding of the plan and was able to repeat key elements of the plan.   Orders Placed This Encounter  Procedures  . Ambulatory referral to Dermatology  . Ambulatory referral to Gastroenterology     Requested Prescriptions    No prescriptions requested or ordered in this encounter    Return in about 6 weeks (around 07/22/2017) for PAP.  Jonah Blueeborah Johnson, MD, FACP

## 2017-06-10 NOTE — Patient Instructions (Signed)
[facility] call [facility] to schedule your mammogram.

## 2017-07-26 ENCOUNTER — Other Ambulatory Visit: Payer: Self-pay | Admitting: Internal Medicine

## 2017-08-23 ENCOUNTER — Encounter: Payer: Self-pay | Admitting: Internal Medicine

## 2017-08-23 ENCOUNTER — Ambulatory Visit: Payer: Self-pay | Attending: Internal Medicine | Admitting: Internal Medicine

## 2017-08-23 VITALS — BP 106/77 | HR 77 | Temp 98.2°F | Resp 18 | Ht 64.0 in | Wt 118.0 lb

## 2017-08-23 DIAGNOSIS — Z0000 Encounter for general adult medical examination without abnormal findings: Secondary | ICD-10-CM | POA: Insufficient documentation

## 2017-08-23 DIAGNOSIS — Z01419 Encounter for gynecological examination (general) (routine) without abnormal findings: Secondary | ICD-10-CM

## 2017-08-23 DIAGNOSIS — Z79899 Other long term (current) drug therapy: Secondary | ICD-10-CM | POA: Insufficient documentation

## 2017-08-23 DIAGNOSIS — Z1231 Encounter for screening mammogram for malignant neoplasm of breast: Secondary | ICD-10-CM

## 2017-08-23 DIAGNOSIS — Z1239 Encounter for other screening for malignant neoplasm of breast: Secondary | ICD-10-CM

## 2017-08-23 NOTE — Progress Notes (Signed)
Patient ID: Kathy Gilbert, female    DOB: June 27, 1961  MRN: [id number]  CC: Gynecologic Exam and Annual Exam   Subjective: Blanch MediaKim Gilbert is a 56 y.o. female who presents for well woman exam Her concerns today include:   1. Pt is G0P0 -no abnormal paps in past. Last pap about 3 yrs ago -no dischg or itching -pt is menopausal. No hot flashes.  -not sexually active -due for MMG. Checks breast periodically. Neg RT breast biopsy 10 yrs ago. No Fhx of breast CA -walks daily for exercise  2. Accidentally hit her head against edge of fridge last week. She wonders if this causes her eyes to look puffy at nights. She did not have any bleeding of scalp. No scalp soreness at this time  HM: referred for colonoscopy on last visit. She has to apply for [facility] discount  Fhx, soc hx, PSH reviewed  Patient Active Problem List   Diagnosis Date Noted  . Vitamin D deficiency 09/07/2016  . PARANOIA 11/30/2008  . Headache above the eye region 11/30/2008  . VISUAL ACUITY, DECREASED 06/20/2008  . GERD 06/20/2008  . HEMORRHOIDS, INTERNAL 04/17/2004  . FIBROCYSTIC BREAST DISEASE 10/31/1997     Current Outpatient Medications on File Prior to Visit  Medication Sig Dispense Refill  . acetaminophen (TYLENOL) 500 MG tablet Take 1 tablet (500 mg total) by mouth every 6 (six) hours as needed. 30 tablet 0  . cetirizine-pseudoephedrine (ZYRTEC-D) 5-120 MG per tablet Take 1 tablet by mouth daily. 15 tablet 0  . lansoprazole (PREVACID) 30 MG capsule Take 30 mg by mouth daily.       No current facility-administered medications on file prior to visit.     Allergies  Allergen Reactions  . Eggs Or Egg-Derived Products     Social History   Socioeconomic History  . Marital status: Single    Spouse name: Not on file  . Number of children: 0  . 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 Highest education level: Master's degree (e.g., MA, MS, MEng, MEd, MSW, MBA)  Social Needs  . Financial resource strain: Not on  file  . Food insecurity - worry: Not on file  . Food insecurity - inability: Not on file  . Transportation needs - medical: Not on file  . Transportation needs - non-medical: Not on file  Occupational History  . Occupation: unemployed  Tobacco Use  . Smoking status: Never Smoker  . Smokeless tobacco: Never Used  Substance and Sexual Activity  . Alcohol use: No  . Drug use: No  . Sexual activity: No  Other Topics Concern  . Not on file  Social History Narrative  . Not on file    Family History  Problem Relation Age of Onset  . Diabetes Mother   . Hypertension Mother     History reviewed. No pertinent surgical history.  ROS: Review of Systems Neg except as stated above  PHYSICAL EXAM: BP 106/77 (BP Location: Left Arm, Patient Position: Sitting, Cuff Size: Normal)   Pulse 77   Temp 98.2 F (36.8 C) (Oral)   Resp 18   Ht 5\' 4"  (1.626 m)   Wt 118 lb (53.5 kg)   SpO2 100%   BMI 20.25 kg/m   Physical Exam  General appearance - alert, well appearing, and in no distress Mental status -flat, odd affect. Scalp: atraumatic  Eyes -pale conjunctiva, EOMI, no periorbital edema Ears - bilateral TM's and external ear canals normal Nose - normal and patent, no  erythema, discharge or polyps Mouth - mucous membranes moist, pharynx normal without lesions Neck - supple, no significant adenopathy Lymphatics -no cervical or axillary LN Chest - clear to auscultation, no wheezes, rales or rhonchi, symmetric air entry Heart - normal rate, regular rhythm, normal S1, S2, no murmurs, rubs, clicks or gallops Abdomen - soft, nontender, nondistended, no masses or organomegaly Breasts -CMA: nubia present: breasts appear normal, no suspicious masses, no skin or nipple changes or axillary nodes Pelvic - CMA, [place], present. No ext lesions. Small speculum inserted into vagina. Before sample could be obtained for pap, pt requested speculum be removed because of pain. Extremities - peripheral  pulses normal, no pedal edema, no clubbing or cyanosis   [facility] 08/23/2017 02/19/2017 09/03/2016  Decreased Interest 0 0 0  Down, Depressed, Hopeless 0 0 0  PHQ - 2 Score 0 0 0  Altered sleeping 0 1 0  Tired, decreased energy 0 1 0  Change in appetite 0 0 0  Feeling bad or failure about yourself  0 0 0  Trouble concentrating 0 0 0  Moving slowly or fidgety/restless 0 0 0  Suicidal thoughts - 0 0  PHQ-9 Score 0 2 0    ASSESSMENT AND PLAN: 1. Well woman exam Pap smear not done as patient reported the exam was too painful.  She will be scheduled if she would like to try again at a later date - Comprehensive metabolic panel - CBC - Lipid panel  2. Breast cancer screening - MM Digital Screening; Future  She has already been referred for colonoscopy.  Awaiting application for [facility] discount Reassurance given about her scalp.  No lesions palpated at this time  Patient was given the opportunity to ask questions.  Patient verbalized understanding of the plan and was able to repeat key elements of the plan.   Orders Placed This Encounter  Procedures  . MM Digital Screening  . Comprehensive metabolic panel  . CBC  . Lipid panel     Requested Prescriptions    No prescriptions requested or ordered in this encounter    Return if symptoms worsen or fail to improve.  Jonah Blueeborah Amauri Keefe, MD, FACP

## 2017-08-24 LAB — CBC
HEMATOCRIT: 40.3 % (ref 34.0–46.6)
Hemoglobin: 12.7 g/dL (ref 11.1–15.9)
MCH: 28.9 pg (ref 26.6–33.0)
MCHC: 31.5 g/dL (ref 31.5–35.7)
MCV: 92 fL (ref 79–97)
PLATELETS: 230 10*3/uL (ref 150–379)
RBC: 4.4 x10E6/uL (ref 3.77–5.28)
RDW: 14.4 % (ref 12.3–15.4)
WBC: 5.4 10*3/uL (ref 3.4–10.8)

## 2017-08-24 LAB — COMPREHENSIVE METABOLIC PANEL
ALBUMIN: 4.4 g/dL (ref 3.5–5.5)
ALK PHOS: 78 IU/L (ref 39–117)
ALT: 21 IU/L (ref 0–32)
AST: 25 IU/L (ref 0–40)
Albumin/Globulin Ratio: 1.5 (ref 1.2–2.2)
BUN / CREAT RATIO: 19 (ref 9–23)
BUN: 14 mg/dL (ref 6–24)
Bilirubin Total: 0.3 mg/dL (ref 0.0–1.2)
CALCIUM: 9.4 mg/dL (ref 8.7–10.2)
CO2: 25 mmol/L (ref 20–29)
CREATININE: 0.74 mg/dL (ref 0.57–1.00)
Chloride: 104 mmol/L (ref 96–106)
GFR calc non Af Amer: 92 mL/min/{1.73_m2} (ref >59)
GFR, EST AFRICAN AMERICAN: 106 mL/min/{1.73_m2} (ref >59)
GLUCOSE: 67 mg/dL (ref 65–99)
Globulin, Total: 2.9 g/dL (ref 1.5–4.5)
Potassium: 4.1 mmol/L (ref 3.5–5.2)
Sodium: 144 mmol/L (ref 134–144)
TOTAL PROTEIN: 7.3 g/dL (ref 6.0–8.5)

## 2017-08-24 LAB — LIPID PANEL
CHOL/HDL RATIO: 2 ratio (ref 0.0–4.4)
Cholesterol, Total: 180 mg/dL (ref 100–199)
HDL: 92 mg/dL (ref >39)
LDL Calculated: 77 mg/dL (ref 0–99)
Triglycerides: 55 mg/dL (ref 0–149)
VLDL Cholesterol Cal: 11 mg/dL (ref 5–40)

## 2017-09-10 ENCOUNTER — Telehealth: Payer: Self-pay | Admitting: *Deleted

## 2017-09-10 NOTE — Telephone Encounter (Signed)
-----   Message from Marcine Matareborah B Johnson, MD sent at 08/25/2017  2:46 PM EST ----- Let patient know that her kidney and liver function tests are normal.  Blood count is normal.  Cholesterol level is good.

## 2017-09-10 NOTE — Telephone Encounter (Signed)
MA unable to leave a message due to ringing and disconnecting. Please inform patient of kidney and liver function being normal as well as blood count and cholesterol level.

## 2017-09-17 ENCOUNTER — Other Ambulatory Visit: Payer: Self-pay | Admitting: Obstetrics and Gynecology

## 2017-09-17 DIAGNOSIS — Z1231 Encounter for screening mammogram for malignant neoplasm of breast: Secondary | ICD-10-CM

## 2017-10-14 ENCOUNTER — Ambulatory Visit (HOSPITAL_COMMUNITY): Payer: Self-pay

## 2017-11-01 ENCOUNTER — Telehealth (HOSPITAL_COMMUNITY): Payer: Self-pay

## 2017-11-08 NOTE — Telephone Encounter (Signed)
Phoned patient to attempt to schedule appointment with [facility]. No answer or return call. 

## 2017-12-23 ENCOUNTER — Ambulatory Visit (HOSPITAL_COMMUNITY): Payer: Self-pay

## 2018-03-31 ENCOUNTER — Ambulatory Visit (HOSPITAL_COMMUNITY): Payer: Self-pay

## 2018-04-08 ENCOUNTER — Telehealth: Payer: Self-pay | Admitting: Internal Medicine

## 2018-04-08 DIAGNOSIS — Z1211 Encounter for screening for malignant neoplasm of colon: Secondary | ICD-10-CM

## 2018-04-08 NOTE — Telephone Encounter (Signed)
The patient called and wanted to inform her pcp that she is currently employed therefore she is now ready and able to go to the colonoscopy specialty. Patient asked if the referral could be opened again.

## 2018-04-14 ENCOUNTER — Telehealth: Payer: Self-pay | Admitting: Internal Medicine

## 2018-04-14 NOTE — Telephone Encounter (Signed)
Evette from [facility] called to inform pcp that the patient will not be able to schedule an appointment with them at this time. Evette stated she would call to inform patient and give her information to reschedule.

## 2019-03-21 ENCOUNTER — Ambulatory Visit: Payer: Self-pay | Attending: Internal Medicine | Admitting: Internal Medicine

## 2019-03-21 ENCOUNTER — Other Ambulatory Visit: Payer: Self-pay

## 2019-03-21 ENCOUNTER — Encounter: Payer: Self-pay | Admitting: Internal Medicine

## 2019-03-21 VITALS — BP 114/79 | HR 68 | Temp 98.3°F | Resp 18 | Ht 64.0 in | Wt 137.0 lb

## 2019-03-21 DIAGNOSIS — R635 Abnormal weight gain: Secondary | ICD-10-CM

## 2019-03-21 DIAGNOSIS — Z1211 Encounter for screening for malignant neoplasm of colon: Secondary | ICD-10-CM

## 2019-03-21 DIAGNOSIS — Z1239 Encounter for other screening for malignant neoplasm of breast: Secondary | ICD-10-CM

## 2019-03-21 NOTE — Progress Notes (Signed)
Patient ID: Kathy Gilbert, female    DOB: 1961-07-27  MRN: [id number]  CC: Establish Care   Subjective: Kathy Gilbert is a 58 y.o. female who presents for f/u care.  Last seen 08/2017 Her concerns today include:   Pt states she wants to schedule her annual exam.  I told her that we can go ahead and do her annual exam today including Pap smear but she declined stating that she wants to schedule it for a future visit.  She is agreeable however to getting some of the health maintenance done today including colon cancer screening and referral for mammogram.  Denies any family history of colon cancer.  No changes in bowel habits or blood in the stools.  Since last visit with [place] she has gained about 20 pounds.  She attributes this to snacking on junk foods and drinking sodas.  She also admits that she is not very active.  Patient Active Problem List   Diagnosis Date Noted  . Vitamin D deficiency 09/07/2016  . PARANOIA 11/30/2008  . Headache above the eye region 11/30/2008  . VISUAL ACUITY, DECREASED 06/20/2008  . GERD 06/20/2008  . HEMORRHOIDS, INTERNAL 04/17/2004  . FIBROCYSTIC BREAST DISEASE 10/31/1997     Current Outpatient Medications on File Prior to Visit  Medication Sig Dispense Refill  . acetaminophen (TYLENOL) 500 MG tablet Take 1 tablet (500 mg total) by mouth every 6 (six) hours as needed. 30 tablet 0  . cetirizine-pseudoephedrine (ZYRTEC-D) 5-120 MG per tablet Take 1 tablet by mouth daily. 15 tablet 0  . lansoprazole (PREVACID) 30 MG capsule Take 30 mg by mouth daily.       No current facility-administered medications on file prior to visit.     Allergies  Allergen Reactions  . Eggs Or Egg-Derived Products     Social History   Socioeconomic History  . Marital status: Single    Spouse name: Not on file  . Number of children: 0  . Years of education: Restaurant manager, fast food  . Highest education level: Master's degree (e.g., MA, MS, MEng, MEd, MSW, MBA)  Occupational History  .  Occupation: unemployed  Social Needs  . Financial resource strain: Not on file  . Food insecurity    Worry: Not on file    Inability: Not on file  . Transportation needs    Medical: Not on file    Non-medical: Not on file  Tobacco Use  . Smoking status: Never Smoker  . Smokeless tobacco: Never Used  Substance and Sexual Activity  . Alcohol use: No  . Drug use: No  . Sexual activity: Never  Lifestyle  . Physical activity    Days per week: Not on file    Minutes per session: Not on file  . Stress: Not on file  Relationships  . Social Herbalist on phone: Not on file    Gets together: Not on file    Attends religious service: Not on file    Active member of club or organization: Not on file    Attends meetings of clubs or organizations: Not on file    Relationship status: Not on file  . Intimate partner violence    Fear of current or ex partner: Not on file    Emotionally abused: Not on file    Physically abused: Not on file    Forced sexual activity: Not on file  Other Topics Concern  . Not on file  Social History Narrative  .  Not on file    Family History  Problem Relation Age of Onset  . Diabetes Mother   . Hypertension Mother     History reviewed. No pertinent surgical history.  ROS: Review of Systems Negative except as stated above  PHYSICAL EXAM: BP 114/79 (BP Location: Left Arm, Patient Position: Sitting, Cuff Size: Normal)   Pulse 68   Temp 98.3 F (36.8 C) (Oral)   Resp 18   Ht 5\' 4"  (1.626 m)   Wt 137 lb (62.1 kg)   LMP 04/21/2016 (Approximate)   SpO2 100%   BMI 23.52 kg/m   Wt Readings from Last 3 Encounters:  03/21/19 137 lb (62.1 kg)  08/23/17 118 lb (53.5 kg)  06/10/17 120 lb (54.4 kg)    Physical Exam  General appearance - alert, well appearing, middle-aged African-American female and in no distress Mental status -patient with quiet demeanor and flat affect. Chest - clear to auscultation, no wheezes, rales or rhonchi,  symmetric air entry Heart - normal rate, regular rhythm, normal S1, S2, no murmurs, rubs, clicks or gallops Abdomen - soft, nontender, nondistended, no masses or organomegaly  CMP Latest Ref Rng & Units 08/23/2017 09/03/2016 06/07/2011  Glucose 65 - 99 mg/dL 67 90 161(W101(H)  BUN 6 - 24 mg/dL 14 13 9   Creatinine 0.57 - 1.00 mg/dL 9.600.74 4.540.74 0.980.80  Sodium 134 - 144 mmol/L 144 139 138  Potassium 3.5 - 5.2 mmol/L 4.1 4.3 4.1  Chloride 96 - 106 mmol/L 104 104 105  CO2 20 - 29 mmol/L 25 27 -  Calcium 8.7 - 10.2 mg/dL 9.4 9.1 -  Total Protein 6.0 - 8.5 g/dL 7.3 - -  Total Bilirubin 0.0 - 1.2 mg/dL 0.3 - -  Alkaline Phos 39 - 117 IU/L 78 - -  AST 0 - 40 IU/L 25 - -  ALT 0 - 32 IU/L 21 - -   Lipid Panel     Component Value Date/Time   CHOL 180 08/23/2017 1537   TRIG 55 08/23/2017 1537   HDL 92 08/23/2017 1537   CHOLHDL 2.0 08/23/2017 1537   LDLCALC 77 08/23/2017 1537    CBC    Component Value Date/Time   WBC 5.4 08/23/2017 1537   WBC 4.8 09/03/2016 1615   RBC 4.40 08/23/2017 1537   RBC 4.37 09/03/2016 1615   HGB 12.7 08/23/2017 1537   HCT 40.3 08/23/2017 1537   PLT 230 08/23/2017 1537   MCV 92 08/23/2017 1537   MCH 28.9 08/23/2017 1537   MCH 29.5 09/03/2016 1615   MCHC 31.5 08/23/2017 1537   MCHC 32.9 09/03/2016 1615   RDW 14.4 08/23/2017 1537   LYMPHSABS 1,920 09/03/2016 1615   MONOABS 336 09/03/2016 1615   EOSABS 96 09/03/2016 1615   BASOSABS 0 09/03/2016 1615    ASSESSMENT AND PLAN: 1. Weight gain Dietary counseling given.  Advised patient to eliminate sugary drinks from her diet to include regular sodas, fruit juices and sweet tea.  Encouraged to drink more water.  Advised to snack on healthy things like fruits or knots rather than junk foods.  Advised to eat more white meat than red meat.  Printed information given. -Encourage her to perform moderate intensity exercise at least 3 to 4 days a week for 30 minutes.  2. Screening for colon cancer Discussed colon cancer  screening.  He is uninsured.  She is agreeable to doing the fit test - Fecal occult blood, imunochemical([facility])  3. Breast cancer screening Given information to apply for  mammogram scholarship. - MM Digital Screening; Future   Patient was given the opportunity to ask questions.  Patient verbalized understanding of the plan and was able to repeat key elements of the plan.   Orders Placed This Encounter  Procedures  . Fecal occult blood, imunochemical([facility])  . MM Digital Screening     Requested Prescriptions    No prescriptions requested or ordered in this encounter    Return in about 2 months (around 05/21/2019) for physical.  Jonah Blueeborah Aneth Schlagel, MD, Jerrel IvoryFACP

## 2019-03-21 NOTE — Patient Instructions (Addendum)
Try to get in some form of moderate intensity exercise at least 3 to 4 days a week for 30 minutes.  This can include brisk walking or riding a bike or any type of exercise that you enjoy doing.  Follow a Healthy Eating Plan - You can do it! Limit sugary drinks.  Avoid sodas, sweet tea, sport or energy drinks, or fruit drinks.  Drink water, lo-fat milk, or diet drinks. Limit snack foods.   Cut back on candy, cake, cookies, chips, ice cream.  These are a special treat, only in small amounts. Eat plenty of vegetables.  Especially dark green, red, and orange vegetables. Aim for at least 3 servings a day. More is better! Include fruit in your daily diet.  Whole fruit is much healthier than fruit juice! Limit "white" bread, "white" pasta, "white" rice.   Choose "100% whole grain" products, brown or wild rice. Avoid fatty meats. Try "Meatless Monday" and choose eggs or beans one day a week.  When eating meat, choose lean meats like chicken, [place], and fish.  Grill, broil, or bake meats instead of frying, and eat poultry without the skin. Eat less salt.  Avoid frozen pizzas, frozen dinners and salty foods.  Use seasonings other than salt in cooking.  This can help blood pressure and keep you from swelling Beer, wine and liquor have calories.  If you can safely drink alcohol, limit to 1 drink per day for women, 2 drinks for men  Please call the [facility] (breast and cervical cancer control program) at [phone] or [phone] to schedule diagnostic mammogram

## 2019-04-18 ENCOUNTER — Telehealth: Payer: Self-pay | Admitting: Internal Medicine

## 2019-04-18 NOTE — Telephone Encounter (Signed)
Pt called to request an update on her lab results Fecal test, please follow up. She states she mailed it in correctly but never heard back.

## 2019-04-18 NOTE — Telephone Encounter (Signed)
Checked with Ms. Sherbert in the lab and there is no results in system for Fecal Test. Tried contacting pt to see when she mailed in test but was unable to reach pt and was unable to lvm

## 2019-05-08 LAB — FECAL OCCULT BLOOD, IMMUNOCHEMICAL

## 2019-05-12 ENCOUNTER — Telehealth: Payer: Self-pay

## 2019-05-12 NOTE — Telephone Encounter (Signed)
Contacted pt to make aware of Fit Test. Pt is aware and she will come by Monday to get a new one

## 2019-05-22 ENCOUNTER — Encounter: Payer: Self-pay | Admitting: Internal Medicine

## 2019-07-11 ENCOUNTER — Encounter: Payer: Self-pay | Admitting: Internal Medicine

## 2019-07-17 ENCOUNTER — Encounter: Payer: Self-pay | Admitting: Internal Medicine

## 2019-07-28 ENCOUNTER — Telehealth: Payer: Self-pay | Admitting: Internal Medicine

## 2019-07-28 NOTE — Telephone Encounter (Signed)
Patient called requesting a kit to have a cologuard test done. Please f/u

## 2019-08-01 NOTE — Telephone Encounter (Signed)
Contacted pt and made aware that she can come by and pick up the cologuard when she gets time. Pt doesn't have any questions or concerns

## 2019-08-04 ENCOUNTER — Other Ambulatory Visit: Payer: Self-pay

## 2019-08-04 DIAGNOSIS — Z1211 Encounter for screening for malignant neoplasm of colon: Secondary | ICD-10-CM

## 2019-10-02 ENCOUNTER — Encounter: Payer: Self-pay | Admitting: Internal Medicine

## 2019-10-04 ENCOUNTER — Other Ambulatory Visit: Payer: Self-pay

## 2019-10-04 ENCOUNTER — Ambulatory Visit: Payer: Self-pay | Attending: Internal Medicine | Admitting: Internal Medicine

## 2019-10-04 ENCOUNTER — Encounter: Payer: Self-pay | Admitting: Internal Medicine

## 2019-10-04 VITALS — BP 125/80 | HR 75 | Resp 16 | Ht 63.0 in | Wt 138.0 lb

## 2019-10-04 DIAGNOSIS — R600 Localized edema: Secondary | ICD-10-CM

## 2019-10-04 DIAGNOSIS — Z0000 Encounter for general adult medical examination without abnormal findings: Secondary | ICD-10-CM

## 2019-10-04 DIAGNOSIS — Z23 Encounter for immunization: Secondary | ICD-10-CM

## 2019-10-04 DIAGNOSIS — Z1211 Encounter for screening for malignant neoplasm of colon: Secondary | ICD-10-CM

## 2019-10-04 DIAGNOSIS — Z1231 Encounter for screening mammogram for malignant neoplasm of breast: Secondary | ICD-10-CM

## 2019-10-04 DIAGNOSIS — Z124 Encounter for screening for malignant neoplasm of cervix: Secondary | ICD-10-CM

## 2019-10-04 MED ORDER — SHINGRIX 50 MCG/0.5ML IM SUSR: 0.5000 mL | Freq: Once | INTRAMUSCULAR | 1 refills | Status: AC

## 2019-10-04 NOTE — Progress Notes (Signed)
Patient ID: Kathy Gilbert, female    DOB: 07-18-1961  MRN: [id number]  CC: Annual Exam   Subjective: Kathy Gilbert is a 58 y.o. female who presents for her well woman exam.   Her concerns today include:   Patient was last seen in June.  At that time she postponed her annual exam for today.  In terms of health maintenance, she is due for mammogram, Pap smear and colon cancer screening.  However today she declines having a Pap smear stating that she prefers referral to gynecology to have this done.  She is uninsured.  I told her that we can do the Pap smear but she declines. -She is postmenopausal.  No vaginal itching or discharge at this time. -Mammogram was ordered on last visit.  Patient is not sure whether she was called for it and whether she had filled out a form for the scholarship to allow her to get the mammogram for free.  She was given a fit test which she turned in but lab reports that the sample was too old to be read.  She did pick up another kit and mailed it in but according to [facility] they did not receive the second one.  Patient Active Problem List   Diagnosis Date Noted  . Vitamin D deficiency 09/07/2016  . PARANOIA 11/30/2008  . Headache above the eye region 11/30/2008  . VISUAL ACUITY, DECREASED 06/20/2008  . GERD 06/20/2008  . HEMORRHOIDS, INTERNAL 04/17/2004  . FIBROCYSTIC BREAST DISEASE 10/31/1997     Current Outpatient Medications on File Prior to Visit  Medication Sig Dispense Refill  . acetaminophen (TYLENOL) 500 MG tablet Take 1 tablet (500 mg total) by mouth every 6 (six) hours as needed. 30 tablet 0  . cetirizine-pseudoephedrine (ZYRTEC-D) 5-120 MG per tablet Take 1 tablet by mouth daily. 15 tablet 0  . lansoprazole (PREVACID) 30 MG capsule Take 30 mg by mouth daily.       No current facility-administered medications on file prior to visit.    Allergies  Allergen Reactions  . Eggs Or Egg-Derived Products     Social History   Socioeconomic  History  . Marital status: Single    Spouse name: Not on file  . Number of children: 0  . Years of education: Restaurant manager, fast food  . Highest education level: Master's degree (e.g., MA, MS, MEng, MEd, MSW, MBA)  Occupational History  . Occupation: unemployed  Tobacco Use  . Smoking status: Never Smoker  . Smokeless tobacco: Never Used  Substance and Sexual Activity  . Alcohol use: No  . Drug use: No  . Sexual activity: Never  Other Topics Concern  . Not on file  Social History Narrative  . Not on file   Social Determinants of Health   Financial Resource Strain:   . Difficulty of Paying Living Expenses: Not on file  Food Insecurity:   . Worried About Charity fundraiser in the Last Year: Not on file  . Ran Out of Food in the Last Year: Not on file  Transportation Needs:   . Lack of Transportation (Medical): Not on file  . Lack of Transportation (Non-Medical): Not on file  Physical Activity:   . Days of Exercise per Week: Not on file  . Minutes of Exercise per Session: Not on file  Stress:   . Feeling of Stress : Not on file  Social Connections:   . Frequency of Communication with Friends and Family: Not on file  . Frequency  of Social Gatherings with Friends and Family: Not on file  . Attends Religious Services: Not on file  . Active Member of Clubs or Organizations: Not on file  . Attends Archivist Meetings: Not on file  . Marital Status: Not on file  Intimate Partner Violence:   . Fear of Current or Ex-Partner: Not on file  . Emotionally Abused: Not on file  . Physically Abused: Not on file  . Sexually Abused: Not on file    Family History  Problem Relation Age of Onset  . Diabetes Mother   . Hypertension Mother     No past surgical history on file.  ROS: Review of Systems  Constitutional: Negative for appetite change and fatigue.       Walks 2 x a wk for 30 mins  HENT: Negative for congestion, dental problem, hearing loss and sore throat.   Eyes: Negative  for visual disturbance.  Respiratory: Negative for cough and shortness of breath.   Cardiovascular: Negative for chest pain, palpitations and leg swelling.  Gastrointestinal: Negative for blood in stool.  Endocrine: Negative for polydipsia and polyuria.  Genitourinary: Negative for difficulty urinating, dysuria and hematuria.  Musculoskeletal: Negative for arthralgias.  Neurological: Positive for headaches (occasionally). Negative for dizziness.  Psychiatric/Behavioral: Negative for dysphoric mood. The patient is not nervous/anxious.     PHYSICAL EXAM: BP 125/80   Pulse 75   Resp 16   Ht '5\' 3"'  (1.6 m)   Wt 138 lb (62.6 kg)   LMP 04/21/2016 (Approximate)   SpO2 100%   BMI 24.45 kg/m   Wt Readings from Last 3 Encounters:  10/04/19 138 lb (62.6 kg)  03/21/19 137 lb (62.1 kg)  08/23/17 118 lb (53.5 kg)  BP 126/80 RT, 120/80 LT  Physical Exam  General appearance - alert, well appearing, African-American female who appears younger than stated age and in no distress Mental status -flat affect. Eyes -slightly pale conjunctiva.  Nonicteric sclera. Ears - bilateral TM's and external ear canals normal Nose - normal and patent, no erythema, discharge or polyps Mouth - mucous membranes moist, pharynx normal without lesions Neck -no thyroid enlargement or thyroid nodules palpated. Lymphatics -no cervical axillary lymphadenopathy.  No hepatosplenomegaly Chest - clear to auscultation, no wheezes, rales or rhonchi, symmetric air entry Heart - normal rate, regular rhythm, normal S1, S2, no murmurs, rubs, clicks or gallops Abdomen - soft, nontender, nondistended, no masses or organomegaly Breasts -done in the presence of RN Carilyn Goodpasture: Breasts appear normal, no suspicious masses, no skin or nipple changes or axillary nodes Extremities -trace to 1+ bilateral lower extremity edema.  Good peripheral pulses.    CMP Latest Ref Rng & Units 08/23/2017 09/03/2016 06/07/2011  Glucose 65 - 99 mg/dL  67 90 101(H)  BUN 6 - 24 mg/dL '14 13 9  ' Creatinine 0.57 - 1.00 mg/dL 0.74 0.74 0.80  Sodium 134 - 144 mmol/L 144 139 138  Potassium 3.5 - 5.2 mmol/L 4.1 4.3 4.1  Chloride 96 - 106 mmol/L 104 104 105  CO2 20 - 29 mmol/L 25 27 -  Calcium 8.7 - 10.2 mg/dL 9.4 9.1 -  Total Protein 6.0 - 8.5 g/dL 7.3 - -  Total Bilirubin 0.0 - 1.2 mg/dL 0.3 - -  Alkaline Phos 39 - 117 IU/L 78 - -  AST 0 - 40 IU/L 25 - -  ALT 0 - 32 IU/L 21 - -   Lipid Panel     Component Value Date/Time   CHOL 180  08/23/2017 1537   TRIG 55 08/23/2017 1537   HDL 92 08/23/2017 1537   CHOLHDL 2.0 08/23/2017 1537   LDLCALC 77 08/23/2017 1537    CBC    Component Value Date/Time   WBC 5.4 08/23/2017 1537   WBC 4.8 09/03/2016 1615   RBC 4.40 08/23/2017 1537   RBC 4.37 09/03/2016 1615   HGB 12.7 08/23/2017 1537   HCT 40.3 08/23/2017 1537   PLT 230 08/23/2017 1537   MCV 92 08/23/2017 1537   MCH 28.9 08/23/2017 1537   MCH 29.5 09/03/2016 1615   MCHC 31.5 08/23/2017 1537   MCHC 32.9 09/03/2016 1615   RDW 14.4 08/23/2017 1537   LYMPHSABS 1,920 09/03/2016 1615   MONOABS 336 09/03/2016 1615   EOSABS 96 09/03/2016 1615   BASOSABS 0 09/03/2016 1615    ASSESSMENT AND PLAN: 1. Annual physical exam -Patient due for Pap smear but she declines having it done stating she would prefer referral to gynecology to have it done.  Referral submitted. Discussed healthy eating habits and regular exercise.  Encouraged her to increase exercise to at least 150 minutes/week total of moderate intensity exercise. - CBC - Comprehensive metabolic panel - Lipid panel  2. Need for shingles vaccine She is wanting shingles vaccine.  We will go ahead and administer that today.  Patient told to return in 2 to 6 months for the second shot in the series.  3. Encounter for screening mammogram for malignant neoplasm of breast Given form for scholarship mammogram.  Encouraged her to have mammogram done - MM Digital Screening; Future  4.  Screening for colon cancer Patient sent in to fit test 1 of which could not be read and the other one could not be found.  She requests referral for colonoscopy.  I have submitted referral.  Advised her that she will need to apply for the [facility] card/[facility] discount card - Ambulatory referral to Gastroenterology  5. Lower extremity edema Advised low-salt diet and to try wearing compression socks.  We have ordered some baseline labs today  6. Pap smear for cervical cancer screening - Ambulatory referral to Gynecology    Patient was given the opportunity to ask questions.  Patient verbalized understanding of the plan and was able to repeat key elements of the plan.   No orders of the defined types were placed in this encounter.    Requested Prescriptions    No prescriptions requested or ordered in this encounter    No follow-ups on file.  Karle Plumber, MD, FACP

## 2019-10-05 ENCOUNTER — Other Ambulatory Visit (HOSPITAL_COMMUNITY): Payer: Self-pay | Admitting: *Deleted

## 2019-10-05 ENCOUNTER — Telehealth: Payer: Self-pay

## 2019-10-05 DIAGNOSIS — Z1231 Encounter for screening mammogram for malignant neoplasm of breast: Secondary | ICD-10-CM

## 2019-10-05 LAB — COMPREHENSIVE METABOLIC PANEL
ALT: 53 IU/L — ABNORMAL HIGH (ref 0–32)
AST: 34 IU/L (ref 0–40)
Albumin/Globulin Ratio: 1.8 (ref 1.2–2.2)
Albumin: 4.6 g/dL (ref 3.8–4.9)
Alkaline Phosphatase: 115 IU/L (ref 39–117)
BUN/Creatinine Ratio: 21 (ref 9–23)
BUN: 14 mg/dL (ref 6–24)
Bilirubin Total: 0.4 mg/dL (ref 0.0–1.2)
CO2: 25 mmol/L (ref 20–29)
Calcium: 9.4 mg/dL (ref 8.7–10.2)
Chloride: 104 mmol/L (ref 96–106)
Creatinine, Ser: 0.66 mg/dL (ref 0.57–1.00)
GFR calc Af Amer: 114 mL/min/{1.73_m2} (ref >59)
GFR calc non Af Amer: 99 mL/min/{1.73_m2} (ref >59)
Globulin, Total: 2.5 g/dL (ref 1.5–4.5)
Glucose: 76 mg/dL (ref 65–99)
Potassium: 4.4 mmol/L (ref 3.5–5.2)
Sodium: 143 mmol/L (ref 134–144)
Total Protein: 7.1 g/dL (ref 6.0–8.5)

## 2019-10-05 LAB — CBC
Hematocrit: 37.8 % (ref 34.0–46.6)
Hemoglobin: 12.3 g/dL (ref 11.1–15.9)
MCH: 29.1 pg (ref 26.6–33.0)
MCHC: 32.5 g/dL (ref 31.5–35.7)
MCV: 90 fL (ref 79–97)
Platelets: 212 10*3/uL (ref 150–450)
RBC: 4.22 x10E6/uL (ref 3.77–5.28)
RDW: 13.1 % (ref 11.7–15.4)
WBC: 5.9 10*3/uL (ref 3.4–10.8)

## 2019-10-05 LAB — LIPID PANEL
Chol/HDL Ratio: 2.2 ratio (ref 0.0–4.4)
Cholesterol, Total: 212 mg/dL — ABNORMAL HIGH (ref 100–199)
HDL: 95 mg/dL (ref >39)
LDL Chol Calc (NIH): 108 mg/dL — ABNORMAL HIGH (ref 0–99)
Triglycerides: 50 mg/dL (ref 0–149)
VLDL Cholesterol Cal: 9 mg/dL (ref 5–40)

## 2019-10-05 NOTE — Telephone Encounter (Signed)
Contacted pt to go over lab results pt is aware and doesn't have any questions or concerns 

## 2019-10-14 LAB — FECAL OCCULT BLOOD, IMMUNOCHEMICAL

## 2019-11-09 ENCOUNTER — Encounter (HOSPITAL_COMMUNITY): Payer: Self-pay

## 2019-11-09 ENCOUNTER — Ambulatory Visit: Payer: Self-pay

## 2019-11-09 ENCOUNTER — Ambulatory Visit (HOSPITAL_COMMUNITY)
Admission: RE | Admit: 2019-11-09 | Discharge: 2019-11-09 | Disposition: A | Discharge status: Home / Self Care | Payer: Self-pay | Source: Ambulatory Visit | Attending: Obstetrics and Gynecology | Admitting: Obstetrics and Gynecology

## 2019-11-09 ENCOUNTER — Other Ambulatory Visit: Payer: Self-pay

## 2019-11-09 DIAGNOSIS — Z1239 Encounter for other screening for malignant neoplasm of breast: Secondary | ICD-10-CM

## 2019-11-09 NOTE — Patient Instructions (Signed)
Explained breast self awareness with Dannielle Burn. Patient refused Pap smear today. Let her know [facility] will cover Pap smears and HPV typing every 5 years unless has a history of abnormal Pap smears. Referred patient to the [facility] for a screening mammogram. Appointment scheduled for Thursday, November 09, 2019 at 1330.    Patient aware of appointment and will be there or reschedule if she can't make it. Let patient know the Breast Center will follow up with her within the next couple weeks with results of her mammogram by letter or phone. Dannielle Burn verbalized understanding.  Ervin Hensley, Kathaleen Maser, RN 12:00 PM

## 2019-11-09 NOTE — Progress Notes (Signed)
No complaints today.   Pap Smear: Pap smear not completed today. Last Pap smear was around 5 years ago  and normal per patient. Patient refused Pap smear today. Explained to patient that the Pap smear is covered by [facility] and she can call to reschedule. Per patient has no history of an abnormal Pap smear. No Pap smear results are in Epic.  Physical exam: Breasts Breasts symmetrical. No skin abnormalities bilateral breasts. No nipple retraction bilateral breasts. No nipple discharge bilateral breasts. No lymphadenopathy. No lumps palpated bilateral breasts. No complaints of pain or tenderness on exam. Referred patient to the [facility] for a screening mammogram. Appointment scheduled for Thursday, November 09, 2019 at 1330.        Pelvic/Bimanual No Pap smear completed today since patient refused.  Smoking History: Patient has never smoked.  Patient Navigation: Patient education provided. Access to services provided for patient through [facility] program.   Colorectal Cancer Screening: Per patient has never had a colonoscopy completed. FIT Test completed 12/14/2009 that was negative. Patient completed a FIT Test 03/21/2019 that was canceled due to the age of specimen. No complaints today.   Breast and Cervical Cancer Risk Assessment: Patient has no family history of breast cancer, known genetic mutations, or radiation treatment to the chest before age 50. Patient has no history of cervical dysplasia, immunocompromised, or DES exposure in-utero.  Risk Assessment    Risk Scores      11/09/2019   Last edited by: Narda Rutherford, LPN   5-year risk: 1.4 %   Lifetime risk: 7.7 %

## 2020-01-11 ENCOUNTER — Ambulatory Visit: Payer: Self-pay

## 2020-02-19 ENCOUNTER — Other Ambulatory Visit: Payer: Self-pay

## 2020-02-19 ENCOUNTER — Ambulatory Visit: Payer: Self-pay

## 2020-02-28 ENCOUNTER — Other Ambulatory Visit: Payer: Self-pay

## 2020-02-28 ENCOUNTER — Ambulatory Visit: Payer: Self-pay | Attending: Internal Medicine

## 2020-02-28 VITALS — BP 122/70 | HR 71 | Temp 97.9°F | Resp 16

## 2020-02-28 DIAGNOSIS — Z111 Encounter for screening for respiratory tuberculosis: Secondary | ICD-10-CM

## 2020-02-28 NOTE — Progress Notes (Signed)
PPD Placement note Kathy Gilbert, 59 y.o. female is here today for placement of PPD test Reason for PPD test: employment  Pt taken PPD test before:YES  Verified in allergy area and with patient that they are not allergic to the products PPD is made of (Phenol or Tween). No previous alergies Is patient taking any oral or IV steroid medication now or have they taken it in the last month?No as per pt statement  Has the patient ever received the BCG vaccine?: YES / tested negative in the past       O: Alert and oriented in NAD. P:   Pt education given / Denies any HX of previous PPD .  PPD placed on 02/28/2020 in the R arm. Injection well tolerated.  Patient advised to return for reading within 48-72 hours. Appointment scheduled for 03/01/2020

## 2020-03-03 ENCOUNTER — Other Ambulatory Visit: Payer: Self-pay

## 2020-03-03 ENCOUNTER — Ambulatory Visit (HOSPITAL_COMMUNITY)
Admission: EM | Admit: 2020-03-03 | Discharge: 2020-03-03 | Disposition: A | Discharge status: Home / Self Care | Payer: Self-pay

## 2020-03-03 NOTE — ED Triage Notes (Signed)
Patient presents to [facility] to have PPD read. PPD was placed on 02/28/2020. Informed patient that she is out of the 48-72 hour window to have the test read. Patient elected to go back to her PCP to have test repeated.

## 2020-03-20 ENCOUNTER — Ambulatory Visit: Payer: Self-pay | Admitting: Pharmacist

## 2020-03-25 ENCOUNTER — Ambulatory Visit: Payer: Self-pay | Admitting: Pharmacist

## 2020-03-27 ENCOUNTER — Ambulatory Visit: Payer: Self-pay | Attending: Internal Medicine

## 2020-03-27 ENCOUNTER — Ambulatory Visit: Payer: Self-pay | Admitting: Pharmacist

## 2020-03-27 VITALS — Temp 98.1°F

## 2020-03-27 DIAGNOSIS — Z23 Encounter for immunization: Secondary | ICD-10-CM

## 2020-03-27 NOTE — Patient Instructions (Signed)
Zoster Vaccine, Recombinant injection What is this medicine? ZOSTER VACCINE (ZOS ter vak SEEN) is used to prevent shingles in adults 57 years old and over. This vaccine is not used to treat shingles or nerve pain from shingles. This medicine may be used for other purposes; ask your health care provider or pharmacist if you have questions. COMMON BRAND NAME(S): SHINGRIX What should I tell my health care provider before I take this medicine? They need to know if you have any of these conditions:  blood disorders or disease  cancer like leukemia or lymphoma  immune system problems or therapy  an unusual or allergic reaction to vaccines, other medications, foods, dyes, or preservatives  pregnant or trying to get pregnant  breast-feeding How should I use this medicine? This vaccine is for injection in a muscle. It is given by a health care professional. Talk to your pediatrician regarding the use of this medicine in children. This medicine is not approved for use in children. Overdosage: If you think you have taken too much of this medicine contact a poison control center or emergency room at once. NOTE: This medicine is only for you. Do not share this medicine with others. What if I miss a dose? Keep appointments for follow-up (booster) doses as directed. It is important not to miss your dose. Call your doctor or health care professional if you are unable to keep an appointment. What may interact with this medicine?  medicines that suppress your immune system  medicines to treat cancer  steroid medicines like prednisone or cortisone This list may not describe all possible interactions. Give your health care provider a list of all the medicines, herbs, non-prescription drugs, or dietary supplements you use. Also tell them if you smoke, drink alcohol, or use illegal drugs. Some items may interact with your medicine. What should I watch for while using this medicine? Visit your doctor for  regular check ups. This vaccine, like all vaccines, may not fully protect everyone. What side effects may I notice from receiving this medicine? Side effects that you should report to your doctor or health care professional as soon as possible:  allergic reactions like skin rash, itching or hives, swelling of the face, lips, or tongue  breathing problems Side effects that usually do not require medical attention (report these to your doctor or health care professional if they continue or are bothersome):  chills  headache  fever  nausea, vomiting  redness, warmth, pain, swelling or itching at site where injected  tiredness This list may not describe all possible side effects. Call your doctor for medical advice about side effects. You may report side effects to FDA at 1-800-FDA-1088. Where should I keep my medicine? This vaccine is only given in a clinic, pharmacy, doctor's office, or other health care setting and will not be stored at home. NOTE: This sheet is a summary. It may not cover all possible information. If you have questions about this medicine, talk to your doctor, pharmacist, or health care provider.  2020 Elsevier/Gold Standard (2017-05-03 13:20:30)  

## 2020-03-27 NOTE — Progress Notes (Signed)
Pt is here for her 2 nd dose of Shingrix vaccine as instructed by PCP. DOB And name verified. Allergies checked. Denies any previous allergies to [employer] and VIS given and explained prior injection administration and possible common  adverse reactions. Verbalized understanding  Injection given via IM in L deltoid muscle. No reaction noted at the injection  Site 15   Min post administration.

## 2020-04-10 ENCOUNTER — Telehealth: Payer: Self-pay | Admitting: Internal Medicine

## 2020-04-10 NOTE — Telephone Encounter (Signed)
Please advise.   Copied from CRM [id number]. Topic: Appointment Scheduling - Scheduling Inquiry for Clinic >> Apr 10, 2020 10:18 AM Leafy Ro wrote: Reason for CRM: pt received her 2nd shingle vaccine  on 03-27-2020 and had a small rash before vaccine and now rash on her neck is spreading and would like  to seen tomorrow or friday

## 2020-04-10 NOTE — Telephone Encounter (Signed)
Called pt / unable to reach /left voice message to call back/

## 2020-04-11 ENCOUNTER — Other Ambulatory Visit: Payer: Self-pay

## 2020-04-11 ENCOUNTER — Encounter (HOSPITAL_COMMUNITY): Payer: Self-pay

## 2020-04-11 ENCOUNTER — Emergency Department (HOSPITAL_COMMUNITY)
Admission: EM | Admit: 2020-04-11 | Discharge: 2020-04-11 | Disposition: A | Discharge status: Home / Self Care | Payer: Self-pay | Attending: Emergency Medicine | Admitting: Emergency Medicine

## 2020-04-11 ENCOUNTER — Emergency Department (HOSPITAL_COMMUNITY): Payer: Self-pay

## 2020-04-11 DIAGNOSIS — R0600 Dyspnea, unspecified: Secondary | ICD-10-CM | POA: Insufficient documentation

## 2020-04-11 DIAGNOSIS — R21 Rash and other nonspecific skin eruption: Secondary | ICD-10-CM | POA: Insufficient documentation

## 2020-04-11 MED ORDER — CLOTRIMAZOLE-BETAMETHASONE 1-0.05 % EX CREA
TOPICAL_CREAM | CUTANEOUS | 0 refills | Status: DC
Start: 2020-04-11 — End: 2023-04-26

## 2020-04-11 NOTE — ED Provider Notes (Signed)
[facility]-EMERGENCY DEPT Provider Note   CSN: [id number] Arrival date & time: 04/11/20  0130     History Chief Complaint  Patient presents with  . Shortness of Breath    Kathy Gilbert is a 59 y.o. female.  HPI Patient presents with rash and shortness of breath.  States she recently had Covid and shingles immunizations for a new job.  Also states she had TB test.  States over the last couple weeks she has had a rash on her neck.  Is on the lower bilateral side of her neck.  Somewhat itchy.  No fevers.  Has not had rash with this before.  Has not really taken any for it. Last night however she woke up acutely with shortness of breath.  States he was doing fine before she went to bed.  She was in the waiting room for around 5-1/2 hours and is feeling much better now.  No chest pain.  No trouble breathing.  No cough.  No swelling in her legs.  Has not had episodes like this before.  No sore throat.  Does not smoke.    Past Medical History:  Diagnosis Date  . Acid reflux     Patient Active Problem List   Diagnosis Date Noted  . Screening breast examination 11/09/2019  . Vitamin D deficiency 09/07/2016  . PARANOIA 11/30/2008  . Headache above the eye region 11/30/2008  . VISUAL ACUITY, DECREASED 06/20/2008  . GERD 06/20/2008  . HEMORRHOIDS, INTERNAL 04/17/2004  . FIBROCYSTIC BREAST DISEASE 10/31/1997    Past Surgical History:  Procedure Laterality Date  . BREAST SURGERY       OB History   No obstetric history on file.     Family History  Problem Relation Age of Onset  . Diabetes Mother   . Hypertension Mother     Social History   Tobacco Use  . Smoking status: Never Smoker  . Smokeless tobacco: Never Used  Vaping Use  . Vaping Use: Never used  Substance Use Topics  . Alcohol use: No  . Drug use: No    Home Medications Prior to Admission medications   Medication Sig Start Date End Date Taking? Authorizing Provider  Multiple Vitamin  (MULTIVITAMIN WITH MINERALS) TABS tablet Take 1 tablet by mouth daily.   Yes [provider]  acetaminophen (TYLENOL) 500 MG tablet Take 1 tablet (500 mg total) by mouth every 6 (six) hours as needed. Patient not taking: Reported on 04/11/2020 02/22/15   Rhetta Mura, MD  cetirizine-pseudoephedrine (ZYRTEC-D) 5-120 MG per tablet Take 1 tablet by mouth daily. Patient not taking: Reported on 04/11/2020 02/25/14   Servando Salina, NP  clotrimazole-betamethasone Thurmond Butts) cream Apply to affected area 2 times daily prn 04/11/20   Benjiman Core, MD    Allergies    Eggs or egg-derived products  Review of Systems   Review of Systems  Constitutional: Negative for appetite change, fatigue and fever.  HENT: Negative for sore throat and trouble swallowing.   Respiratory: Positive for shortness of breath.   Cardiovascular: Negative for chest pain.  Gastrointestinal: Negative for abdominal pain.  Musculoskeletal: Negative for back pain.  Skin: Positive for rash.  Neurological: Negative for weakness.  Psychiatric/Behavioral: Negative for confusion.    Physical Exam Updated Vital Signs BP 115/81 (BP Location: Right Arm)   Pulse 89   Temp 97.9 F (36.6 C) (Oral)   Resp 18   LMP 04/21/2016 (Approximate)   SpO2 98%   Physical Exam  Vitals and nursing note reviewed.  HENT:     Head: Atraumatic.     Mouth/Throat:     Comments: Mild posterior pharyngeal erythema or exudate. Cardiovascular:     Rate and Rhythm: Regular rhythm.  Pulmonary:     Breath sounds: No wheezing, rhonchi or rales.  Chest:     Chest wall: No tenderness.  Musculoskeletal:     Right lower leg: No edema.     Left lower leg: No edema.  Skin:    General: Skin is warm.     Comments: On bilateral lateral lower neck there are 2 circular approximately 2 cm rounded mildly scaly areas.  No erythema.  No induration.  Neurological:     Mental Status: She is alert and oriented to person, place, and time.      ED Results / Procedures / Treatments   Labs (all labs ordered are listed, but only abnormal results are displayed) Labs Reviewed - No data to display  EKG EKG Interpretation  Date/Time:  Thursday April 11 2020 02:33:40 EDT Ventricular Rate:  90 PR Interval:    QRS Duration: 65 QT Interval:  358 QTC Calculation: 438 R Axis:   81 Text Interpretation: Sinus rhythm Right atrial enlargement Left ventricular hypertrophy No significant change was found Confirmed by Paula Libra ([id number]) on 04/11/2020 3:13:24 AM   Radiology DG Chest 2 View  Result Date: 04/11/2020 CLINICAL DATA:  Shortness of breath EXAM: CHEST - 2 VIEW COMPARISON:  11/13/2007 FINDINGS: Cardiac shadow is within normal limits. Bilateral nipple shadows are seen. The lungs are clear without focal infiltrate or sizable effusion. No bony abnormality is seen. IMPRESSION: No active cardiopulmonary disease. Electronically Signed   By: Alcide Clever M.D.   On: 04/11/2020 03:03    Procedures Procedures (including critical care time)  Medications Ordered in ED Medications - No data to display  ED Course  I have reviewed the triage vital signs and the nursing notes.  Pertinent labs & imaging results that were available during my care of the patient were reviewed by me and considered in my medical decision making (see chart for details).    MDM Rules/Calculators/A&P                         Patient with rash on neck.  Potentially fungal.  Does not appear to be infected.  Will treat with antifungal steroids. Also had dyspnea.  Began acutely while sleeping.  Woke up and after headache somewhat quickly resolved.  X-ray reassuring.  EKG reassuring.  Symptoms resolved.  Doubt cardiac or ischemic cause.  Doubt pulmonary embolism.  Potentially could have been something like a reflux.  I think stable for discharge home. Final Clinical Impression(s) / ED Diagnoses Final diagnoses:  Rash  Dyspnea, unspecified type    Rx / DC  Orders ED Discharge Orders         Ordered    clotrimazole-betamethasone (LOTRISONE) cream     Discontinue  Reprint     04/11/20 0715           Benjiman Core, MD 04/11/20 [phone]

## 2020-04-11 NOTE — Telephone Encounter (Signed)
Called pt again at [phone] unable to reach/ Left voice message to call back. Name and phone nr provided.

## 2020-04-11 NOTE — Discharge Instructions (Addendum)
Your rash looks as if it could be fungal.  Use the cream to help.  Follow-up with your doctor as needed.

## 2020-04-11 NOTE — ED Triage Notes (Addendum)
Pt reports that she woke up tonight and felt short of breath. She got up and sat in the chair and the feeling did not subside so she decided to come in. No distress or labored breathing in triage. She also reports a rash on both sides of her neck for the last 1-2 weeks.

## 2020-04-15 NOTE — Telephone Encounter (Signed)
Pt was seen by MD in the ED for her rash/ sent home with meds

## 2020-05-23 ENCOUNTER — Encounter: Payer: Self-pay | Admitting: Physician Assistant

## 2020-05-30 ENCOUNTER — Encounter: Payer: Self-pay | Admitting: Internal Medicine

## 2020-07-11 ENCOUNTER — Encounter: Payer: Self-pay | Admitting: Internal Medicine

## 2020-08-05 ENCOUNTER — Ambulatory Visit: Payer: Self-pay | Admitting: Internal Medicine

## 2021-02-25 ENCOUNTER — Encounter: Payer: Self-pay | Admitting: Internal Medicine

## 2021-04-03 ENCOUNTER — Telehealth: Payer: Self-pay | Admitting: Internal Medicine

## 2021-04-03 NOTE — Telephone Encounter (Signed)
Patient was scheduled with Dr. Laural Benes on 04/08/21 at 2:50 pm but provider will be working virtual. Called patient and not able to leave a vm. If patient returns call please schedule patient for Dr. Laural Benes next available physical slot.

## 2021-04-08 ENCOUNTER — Encounter: Payer: Self-pay | Admitting: Internal Medicine

## 2021-05-16 ENCOUNTER — Other Ambulatory Visit: Payer: Self-pay

## 2021-05-16 ENCOUNTER — Encounter: Payer: Self-pay | Admitting: Internal Medicine

## 2021-05-16 ENCOUNTER — Ambulatory Visit: Payer: Self-pay | Attending: Internal Medicine | Admitting: Internal Medicine

## 2021-05-16 VITALS — BP 125/73 | HR 66 | Ht 63.0 in | Wt 119.6 lb

## 2021-05-16 DIAGNOSIS — Z0000 Encounter for general adult medical examination without abnormal findings: Secondary | ICD-10-CM

## 2021-05-16 DIAGNOSIS — Z1231 Encounter for screening mammogram for malignant neoplasm of breast: Secondary | ICD-10-CM

## 2021-05-16 DIAGNOSIS — Z5320 Procedure and treatment not carried out because of patient's decision for unspecified reasons: Secondary | ICD-10-CM

## 2021-05-16 DIAGNOSIS — Z23 Encounter for immunization: Secondary | ICD-10-CM

## 2021-05-16 DIAGNOSIS — L84 Corns and callosities: Secondary | ICD-10-CM

## 2021-05-16 DIAGNOSIS — Z111 Encounter for screening for respiratory tuberculosis: Secondary | ICD-10-CM

## 2021-05-16 DIAGNOSIS — Z1211 Encounter for screening for malignant neoplasm of colon: Secondary | ICD-10-CM

## 2021-05-16 NOTE — Progress Notes (Signed)
Patient ID: Kathy Gilbert, female    DOB: 1960/10/16  MRN: [id number]  CC: Annual Exam and Gynecologic Exam   Subjective: Kathy Gilbert is a 60 y.o. female who presents for annual exam Her concerns today include:   Pt here for physical but does want pap Due for MMG. Due for colon CA screen.  Given FIT in past but specimen too old by time it was turned in.  Willing to try doing it again.  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  She has a form with her today's that is requesting documentation of immunizations including MMR, varicella, hepatitis B, COVID-19 vaccines and last TB skin test.  She does not have records of her vaccines.  She tells me that she has had 2 COVID-19 shots but does not have her card with her today.  Patient Active Problem List   Diagnosis Date Noted   Screening breast examination 11/09/2019   Vitamin D deficiency 09/07/2016   PARANOIA 11/30/2008   Headache above the eye region 11/30/2008   VISUAL ACUITY, DECREASED 06/20/2008   GERD 06/20/2008   HEMORRHOIDS, INTERNAL 04/17/2004   FIBROCYSTIC BREAST DISEASE 10/31/1997     Current Outpatient Medications on File Prior to Visit  Medication Sig Dispense Refill   acetaminophen (TYLENOL) 500 MG tablet Take 1 tablet (500 mg total) by mouth every 6 (six) hours as needed. (Patient not taking: No sig reported) 30 tablet 0   cetirizine-pseudoephedrine (ZYRTEC-D) 5-120 MG per tablet Take 1 tablet by mouth daily. (Patient not taking: No sig reported) 15 tablet 0   clotrimazole-betamethasone (LOTRISONE) cream Apply to affected area 2 times daily prn (Patient not taking: Reported on 05/16/2021) 15 g 0   Multiple Vitamin (MULTIVITAMIN WITH MINERALS) TABS tablet Take 1 tablet by mouth daily. (Patient not taking: Reported on 05/16/2021)     No current facility-administered medications on file prior to visit.    Allergies  Allergen Reactions   Eggs Or Egg-Derived Products Nausea And Vomiting and Rash     Social History   Socioeconomic History   Marital status: Single    Spouse name: Not on file   Number of children: 0   Years of education: Restaurant manager, fast food   Highest education level: Master's degree (e.g., MA, MS, MEng, MEd, MSW, MBA)  Occupational History   Occupation: unemployed  Tobacco Use   Smoking status: Never   Smokeless tobacco: Never  Vaping Use   Vaping Use: Never used  Substance and Sexual Activity   Alcohol use: No   Drug use: No   Sexual activity: Never  Other Topics Concern   Not on file  Social History Narrative   Not on file   Social Determinants of Health   Financial Resource Strain: Not on file  Food Insecurity: Not on file  Transportation Needs: Not on file  Physical Activity: Not on file  Stress: Not on file  Social Connections: Not on file  Intimate Partner Violence: Not on file    Family History  Problem Relation Age of Onset   Diabetes Mother    Hypertension Mother     Past Surgical History:  Procedure Laterality Date   BREAST SURGERY      ROS: Review of Systems  Constitutional:  Negative for activity change, appetite change and fatigue.       She exercises 2-3 times a week for 1 to 2 hours.  Overall she feels she does well with her eating habits.  HENT:  Negative for  congestion, dental problem, ear pain, hearing loss, postnasal drip and sore throat.   Eyes:        Has reading glasses and also glasses for distance.  Last eye exam was 2 years ago.  She feels that her glasses need to be upgraded.  She is uninsured.  Respiratory:  Negative for cough, chest tightness and shortness of breath.   Cardiovascular:  Negative for chest pain, palpitations and leg swelling.  Gastrointestinal:        No problems swallowing.  She moves her bowels okay.  No blood in the stools.  Endocrine: Negative for cold intolerance, heat intolerance, polydipsia and polyphagia.  Genitourinary:  Negative for difficulty urinating and dysuria.       She is postmenopausal.   She is not sexually active.  Musculoskeletal:  Negative for arthralgias.  Skin:  Negative for rash.       Complains of having a lesion on her right middle toe that is uncomfortable in her shoes.  She would like to see a dermatologist.  Neurological:  Negative for dizziness, light-headedness and headaches.  Psychiatric/Behavioral:  Negative for dysphoric mood. The patient is not nervous/anxious.     PHYSICAL EXAM: BP 125/73   Pulse 66   Ht '5\' 3"'  (1.6 m)   Wt 119 lb 9.6 oz (54.3 kg)   LMP 04/21/2016 (Approximate)   SpO2 100%   BMI 21.19 kg/m    Physical Exam  General appearance - alert, well appearing, middle-aged African-American female and in no distress Mental status -patient is soft-spoken.  She has a flat affect. Eyes - pupils equal and reactive, extraocular eye movements intact.  Pale conjunctiva. Ears - bilateral TM's and external ear canals normal Nose -mild enlargement of nasal turbinates bilaterally Mouth - mucous membranes moist, pharynx normal without lesions Neck - supple, no significant adenopathy Lymphatics - no palpable lymphadenopathy, no hepatosplenomegaly Chest - clear to auscultation, no wheezes, rales or rhonchi, symmetric air entry Heart - normal rate, regular rhythm, normal S1, S2, no murmurs, rubs, clicks or gallops Abdomen - soft, nontender, nondistended, no masses or organomegaly Breasts -breast exam done in the presence of CMA Oretha Milch: Breasts appear normal, no suspicious masses, no skin or nipple changes or axillary nodes Pelvic -patient declined Musculoskeletal - no joint tenderness, deformity or swelling Extremities - peripheral pulses normal, no pedal edema, no clubbing or cyanosis Skin -patient has a large corn on the dorsal surface of the right fourth toe.  She has a small callus on the ball of the right foot and the ball of the lateral surface of the left foot.   CMP Latest Ref Rng & Units 10/04/2019 08/23/2017 09/03/2016  Glucose 65 -  99 mg/dL 76 67 90  BUN 6 - 24 mg/dL '14 14 13  ' Creatinine 0.57 - 1.00 mg/dL 0.66 0.74 0.74  Sodium 134 - 144 mmol/L 143 144 139  Potassium 3.5 - 5.2 mmol/L 4.4 4.1 4.3  Chloride 96 - 106 mmol/L 104 104 104  CO2 20 - 29 mmol/L '25 25 27  ' Calcium 8.7 - 10.2 mg/dL 9.4 9.4 9.1  Total Protein 6.0 - 8.5 g/dL 7.1 7.3 -  Total Bilirubin 0.0 - 1.2 mg/dL 0.4 0.3 -  Alkaline Phos 39 - 117 IU/L 115 78 -  AST 0 - 40 IU/L 34 25 -  ALT 0 - 32 IU/L 53(H) 21 -   Lipid Panel     Component Value Date/Time   CHOL 212 (H) 10/04/2019 1140   TRIG 50  10/04/2019 1140   HDL 95 10/04/2019 1140   CHOLHDL 2.2 10/04/2019 1140   LDLCALC 108 (H) 10/04/2019 1140    CBC    Component Value Date/Time   WBC 5.9 10/04/2019 1140   WBC 4.8 09/03/2016 1615   RBC 4.22 10/04/2019 1140   RBC 4.37 09/03/2016 1615   HGB 12.3 10/04/2019 1140   HCT 37.8 10/04/2019 1140   PLT 212 10/04/2019 1140   MCV 90 10/04/2019 1140   MCH 29.1 10/04/2019 1140   MCH 29.5 09/03/2016 1615   MCHC 32.5 10/04/2019 1140   MCHC 32.9 09/03/2016 1615   RDW 13.1 10/04/2019 1140   LYMPHSABS 1,920 09/03/2016 1615   MONOABS 336 09/03/2016 1615   EOSABS 96 09/03/2016 1615   BASOSABS 0 09/03/2016 1615    ASSESSMENT AND PLAN: 1. Annual physical exam Encouraged her to continue healthy eating habits and regular exercise. Encouraged use of sunscreen when expecting long exposure to direct sun.  Encouraged use of seatbelts when driving. TB skin test to be done today per her request for program she is applying to.  In regards to her immunizations, I advised patient that we can draw blood to see whether she has been immunized against MMR, varicella and hepatitis B or she can go to the health department and see whether they have records of when she received these vaccines.  She prefers to do the latter.  Advised to also get a copy of records from her COVID-19 vaccinations.  If the health department does not have records, then she will consider coming to  the lab to have the blood test done instead. - CBC - Comprehensive metabolic panel - Lipid panel  2. Encounter for screening mammogram for malignant neoplasm of breast - MM Digital Screening; Future  3. Pap smear of cervix declined Recommended.  Patient declined.  4. Need for Tdap vaccination Given today.  5. Screening-pulmonary TB - TB Skin Test  6. Corn of toe 7. Pre-ulcerative corn or callous Advised patient to apply for the orange card/cone discount.  Once approved we can refer her to the podiatrist.  She will let me know once she is approved.  8. Screening for colon cancer - Fecal occult blood, imunochemical([facility])   Patient was given the opportunity to ask questions.  Patient verbalized understanding of the plan and was able to repeat key elements of the plan.   No orders of the defined types were placed in this encounter.    Requested Prescriptions    No prescriptions requested or ordered in this encounter    No follow-ups on file.  Karle Plumber, MD, FACP

## 2021-05-16 NOTE — Patient Instructions (Signed)
Preventive Care 58-58 Years Old, Female Preventive care refers to lifestyle choices and visits with your health care provider that can promote health and wellness. This includes: A yearly physical exam. This is also called an annual wellness visit. Regular dental and eye exams. Immunizations. Screening for certain conditions. Healthy lifestyle choices, such as: Eating a healthy diet. Getting regular exercise. Not using drugs or products that contain nicotine and tobacco. Limiting alcohol use. What can I expect for my preventive care visit? Physical exam Your health care provider will check your: Height and weight. These may be used to calculate your BMI (body mass index). BMI is a measurement that tells if you are at a healthy weight. Heart rate and blood pressure. Body temperature. Skin for abnormal spots. Counseling Your health care provider may ask you questions about your: Past medical problems. Family's medical history. Alcohol, tobacco, and drug use. Emotional well-being. Home life and relationship well-being. Sexual activity. Diet, exercise, and sleep habits. Work and work Statistician. Access to firearms. Method of birth control. Menstrual cycle. Pregnancy history. What immunizations do I need?  Vaccines are usually given at various ages, according to a schedule. Your health care provider will recommend vaccines for you based on your age, medicalhistory, and lifestyle or other factors, such as travel or where you work. What tests do I need? Blood tests Lipid and cholesterol levels. These may be checked every 5 years, or more often if you are over 58 years old. Hepatitis C test. Hepatitis B test. Screening Lung cancer screening. You may have this screening every year starting at age 30 if you have a 30-pack-year history of smoking and currently smoke or have quit within the past 15 years. Colorectal cancer screening. All adults should have this screening starting at  age 58 and continuing until age 3. Your health care provider may recommend screening at age 58 if you are at increased risk. You will have tests every 1-10 years, depending on your results and the type of screening test. Diabetes screening. This is done by checking your blood sugar (glucose) after you have not eaten for a while (fasting). You may have this done every 1-3 years. Mammogram. This may be done every 1-2 years. Talk with your health care provider about when you should start having regular mammograms. This may depend on whether you have a family history of breast cancer. BRCA-related cancer screening. This may be done if you have a family history of breast, ovarian, tubal, or peritoneal cancers. Pelvic exam and Pap test. This may be done every 3 years starting at age 58. Starting at age 54, this may be done every 5 years if you have a Pap test in combination with an HPV test. Other tests STD (sexually transmitted disease) testing, if you are at risk. Bone density scan. This is done to screen for osteoporosis. You may have this scan if you are at high risk for osteoporosis. Talk with your health care provider about your test results, treatment options,and if necessary, the need for more tests. Follow these instructions at home: Eating and drinking  Eat a diet that includes fresh fruits and vegetables, whole grains, lean protein, and low-fat dairy products. Take vitamin and mineral supplements as recommended by your health care provider. Do not drink alcohol if: Your health care provider tells you not to drink. You are pregnant, may be pregnant, or are planning to become pregnant. If you drink alcohol: Limit how much you have to 0-1 drink a day. Be aware  of how much alcohol is in your drink. In the [place] one drink equals one 12 oz bottle of beer (355 mL), one 5 oz glass of wine (148 mL), or one 1 oz glass of hard liquor (44 mL).  Lifestyle Take daily care of your teeth and  gums. Brush your teeth every morning and night with fluoride toothpaste. Floss one time each day. Stay active. Exercise for at least 30 minutes 5 or more days each week. Do not use any products that contain nicotine or tobacco, such as cigarettes, e-cigarettes, and chewing tobacco. If you need help quitting, ask your health care provider. Do not use drugs. If you are sexually active, practice safe sex. Use a condom or other form of protection to prevent STIs (sexually transmitted infections). If you do not wish to become pregnant, use a form of birth control. If you plan to become pregnant, see your health care provider for a prepregnancy visit. If told by your health care provider, take low-dose aspirin daily starting at age 29. Find healthy ways to cope with stress, such as: Meditation, yoga, or listening to music. Journaling. Talking to a trusted person. Spending time with friends and family. Safety Always wear your seat belt while driving or riding in a vehicle. Do not drive: If you have been drinking alcohol. Do not ride with someone who has been drinking. When you are tired or distracted. While texting. Wear a helmet and other protective equipment during sports activities. If you have firearms in your house, make sure you follow all gun safety procedures. What's next? Visit your health care provider once a year for an annual wellness visit. Ask your health care provider how often you should have your eyes and teeth checked. Stay up to date on all vaccines. This information is not intended to replace advice given to you by your health care provider. Make sure you discuss any questions you have with your healthcare provider. Document Revised: 06/25/2020 Document Reviewed: 06/02/2018 Elsevier Patient Education  2022 [employer].

## 2021-05-17 LAB — CBC
Hematocrit: 39.2 % (ref 34.0–46.6)
Hemoglobin: 12.9 g/dL (ref 11.1–15.9)
MCH: 29.3 pg (ref 26.6–33.0)
MCHC: 32.9 g/dL (ref 31.5–35.7)
MCV: 89 fL (ref 79–97)
Platelets: 189 10*3/uL (ref 150–450)
RBC: 4.4 x10E6/uL (ref 3.77–5.28)
RDW: 13.2 % (ref 11.7–15.4)
WBC: 5.1 10*3/uL (ref 3.4–10.8)

## 2021-05-17 LAB — LIPID PANEL
Chol/HDL Ratio: 2 ratio (ref 0.0–4.4)
Cholesterol, Total: 176 mg/dL (ref 100–199)
HDL: 88 mg/dL (ref >39)
LDL Chol Calc (NIH): 79 mg/dL (ref 0–99)
Triglycerides: 43 mg/dL (ref 0–149)
VLDL Cholesterol Cal: 9 mg/dL (ref 5–40)

## 2021-05-17 LAB — COMPREHENSIVE METABOLIC PANEL
ALT: 17 IU/L (ref 0–32)
AST: 20 IU/L (ref 0–40)
Albumin/Globulin Ratio: 1.7 (ref 1.2–2.2)
Albumin: 4.7 g/dL (ref 3.8–4.9)
Alkaline Phosphatase: 80 IU/L (ref 44–121)
BUN/Creatinine Ratio: 13 (ref 9–23)
BUN: 10 mg/dL (ref 6–24)
Bilirubin Total: 0.6 mg/dL (ref 0.0–1.2)
CO2: 23 mmol/L (ref 20–29)
Calcium: 9.2 mg/dL (ref 8.7–10.2)
Chloride: 107 mmol/L — ABNORMAL HIGH (ref 96–106)
Creatinine, Ser: 0.78 mg/dL (ref 0.57–1.00)
Globulin, Total: 2.7 g/dL (ref 1.5–4.5)
Glucose: 84 mg/dL (ref 65–99)
Potassium: 4.6 mmol/L (ref 3.5–5.2)
Sodium: 145 mmol/L — ABNORMAL HIGH (ref 134–144)
Total Protein: 7.4 g/dL (ref 6.0–8.5)
eGFR: 88 mL/min/{1.73_m2} (ref >59)

## 2021-05-17 NOTE — Progress Notes (Signed)
Blood cell counts are normal.  Kidney and liver function tests normal.  Cholesterol level normal.  Sodium level mildly elevated.  Encourage to drink 4 to 8 glasses of water daily.

## 2021-05-27 ENCOUNTER — Ambulatory Visit: Payer: Self-pay | Admitting: *Deleted

## 2021-05-27 NOTE — Telephone Encounter (Signed)
Pt called stating that she is experiencing pain in her toes. She states that she is a diabetic, and this has been going on for 3-4 weeks. Please advise.   Attempted to call patient- no answer- left message on voicemail to call back

## 2021-05-27 NOTE — Telephone Encounter (Signed)
Patient called, left VM to return the call to the office to discuss symptoms with a nurse. Unable to reach patient after 3 attempts by [facility] NT, routing to the provider for resolution per protocol.     Message from Gwenlyn Fudge sent at 05/27/2021  3:06 PM EDT  Pt called stating that she is experiencing pain in her toes. She states that she is a diabetic, and this has been going on for 3-4 weeks. Please advise.

## 2021-05-27 NOTE — Telephone Encounter (Signed)
Second attempt- rang and disconnected- no answer

## 2021-05-28 NOTE — Telephone Encounter (Signed)
Contacted pt to get more information and to possibly scheduling an appt pt didn't answer lvm to give [place] a call back

## 2021-05-29 ENCOUNTER — Telehealth: Payer: Self-pay | Admitting: Internal Medicine

## 2021-05-29 NOTE — Telephone Encounter (Signed)
Copied from CRM [id number]. Topic: General - Other >> May 27, 2021  3:07 PM Gwenlyn Fudge wrote: Reason for CRM: Pt called in and is requesting to have a nurse give her a call back to go over her recent lab results. Please advise.

## 2021-05-30 ENCOUNTER — Encounter: Payer: Self-pay | Admitting: *Deleted

## 2021-05-30 NOTE — Telephone Encounter (Signed)
Unable to reach patient   Voice mail not set up

## 2021-06-03 ENCOUNTER — Encounter: Payer: Self-pay | Admitting: *Deleted

## 2021-06-13 ENCOUNTER — Telehealth: Payer: Self-pay | Admitting: Internal Medicine

## 2021-06-13 NOTE — Telephone Encounter (Signed)
Pt dropped off immunization record docs to be filled out by Provider  and Faxed to Kathy Gilbert @ [phone] . Please advise and thank you

## 2021-06-16 NOTE — Telephone Encounter (Signed)
Form has been placed in provider folder

## 2021-06-17 ENCOUNTER — Telehealth: Payer: Self-pay | Admitting: Internal Medicine

## 2021-06-17 DIAGNOSIS — Z0184 Encounter for antibody response examination: Secondary | ICD-10-CM

## 2021-06-18 NOTE — Telephone Encounter (Signed)
Contacted pt to go over provider response. Pt states she doesn't take the vaccine. Also made pt aware that she had a ppd placement on 8/12 and didn't come back to get it read so she will need another ppd. Pt states she will come at the end of the month beginning of Oct to get labs done because she just received a bill from [place].

## 2021-07-02 ENCOUNTER — Ambulatory Visit: Payer: Self-pay | Admitting: Podiatry

## 2021-08-08 ENCOUNTER — Telehealth (INDEPENDENT_AMBULATORY_CARE_PROVIDER_SITE_OTHER): Payer: Self-pay

## 2021-08-08 NOTE — Telephone Encounter (Signed)
Contacted pt and schedule her an appt with Cari

## 2021-08-08 NOTE — Telephone Encounter (Signed)
Copied from CRM [id number]. Topic: General - Other >> Aug 07, 2021  3:17 PM Laural Benes, [place] C wrote: Reason for CRM: pt called in for assistance. 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 Pt says that she also need to have a Hepatitis shot. Pt would like further assistance.

## 2021-08-12 ENCOUNTER — Ambulatory Visit: Payer: Self-pay | Admitting: Physician Assistant

## 2021-08-15 ENCOUNTER — Ambulatory Visit: Payer: Self-pay | Admitting: Internal Medicine

## 2021-08-21 ENCOUNTER — Other Ambulatory Visit: Payer: Self-pay

## 2021-08-21 ENCOUNTER — Ambulatory Visit: Payer: Self-pay | Attending: Internal Medicine | Admitting: Physician Assistant

## 2021-08-21 ENCOUNTER — Encounter: Payer: Self-pay | Admitting: Physician Assistant

## 2021-08-21 VITALS — BP 123/73 | HR 85 | Ht 63.0 in | Wt 117.0 lb

## 2021-08-21 DIAGNOSIS — Z23 Encounter for immunization: Secondary | ICD-10-CM

## 2021-08-21 DIAGNOSIS — Z0184 Encounter for antibody response examination: Secondary | ICD-10-CM

## 2021-08-21 NOTE — Patient Instructions (Signed)
Your MMR and varicella titers are being drawn today and you will be given your first hepatitis B vaccine

## 2021-08-21 NOTE — Progress Notes (Signed)
   Kathy Gilbert, is a 60 y.o. female  [id number]  [id number]  DOB - 02-Oct-1961  Chief Complaint  Patient presents with   Immunizations       Subjective:   Kathy Gilbert is a 60 y.o. female here today for Hep B series.  Never had Hep B series.  Dr Wynetta Emery has ordered titers for MMR and varicella that will be drawn today.  No febrile illnesses in the last couple of weeks.  No recent travel.    No problems updated.  ALLERGIES: Allergies  Allergen Reactions   Eggs Or Egg-Derived Products Nausea And Vomiting and Rash    PAST MEDICAL HISTORY: Past Medical History:  Diagnosis Date   Acid reflux     MEDICATIONS AT HOME: Prior to Admission medications   Medication Sig Start Date End Date Taking? Authorizing Provider  acetaminophen (TYLENOL) 500 MG tablet Take 1 tablet (500 mg total) by mouth every 6 (six) hours as needed. Patient not taking: Reported on 04/11/2020 02/22/15   Nita Sells, MD  cetirizine-pseudoephedrine (ZYRTEC-D) 5-120 MG per tablet Take 1 tablet by mouth daily. Patient not taking: Reported on 04/11/2020 02/25/14   Nehemiah Settle, NP  clotrimazole-betamethasone Donalynn Furlong) cream Apply to affected area 2 times daily prn Patient not taking: Reported on 05/16/2021 04/11/20   Davonna Belling, MD  Multiple Vitamin (MULTIVITAMIN WITH MINERALS) TABS tablet Take 1 tablet by mouth daily. Patient not taking: Reported on 05/16/2021    [provider]    ROS: Neg HEENT Neg resp Neg cardiac Neg GI Neg GU Neg MS Neg psych Neg neuro  Objective:   Vitals:   08/21/21 1019  BP: 123/73  Pulse: 85  SpO2: 100%  Weight: 117 lb (53.1 kg)  Height: $Remove'5\' 3"'DvQyeFc$  (1.6 m)   Exam General appearance : Awake, alert, not in any distress. Speech Clear. Not toxic looking HEENT: Atraumatic and Normocephalic Neck: Supple, no JVD. No cervical lymphadenopathy.  Chest: Good air entry bilaterally, CTAB.  No rales/rhonchi/wheezing CVS: S1 S2 regular, no murmurs.   Extremities: B/L Lower Ext shows no edema, both legs are warm to touch Neurology: Awake alert, and oriented X 3, CN II-XII intact, Non focal Skin: No Rash  Data Review Lab Results  Component Value Date   HGBA1C 5.6 09/03/2016    Assessment & Plan   1. Need for vaccination against hepatitis B virus - Hepatitis B vaccine adult IM  1st vaccine initiated.        Patient have been counseled extensively about nutrition and exercise. Other issues discussed during this visit include: low cholesterol diet, weight control and daily exercise, foot care, annual eye examinations at Ophthalmology, importance of adherence with medications and regular follow-up. We also discussed long term complications of uncontrolled diabetes and hypertension.   Return if symptoms worsen or fail to improve.  The patient was given clear instructions to go to ER or return to medical center if symptoms don't improve, worsen or new problems develop. The patient verbalized understanding. The patient was told to call to get lab results if they haven't heard anything in the next week.      Freeman Caldron, PA-C [facility] and [place], [place]   08/21/2021, 10:39 AM Patient ID: Kathy Gilbert, female   DOB: September 08, 1961, 60 y.o.   MRN: [id number]

## 2021-08-22 LAB — MEASLES/MUMPS/RUBELLA IMMUNITY
MUMPS ABS, IGG: 70.1 AU/mL (ref >10.9)
RUBEOLA AB, IGG: 44.9 AU/mL (ref >16.4)
Rubella Antibodies, IGG: 7.69 index (ref >0.99)

## 2021-08-22 LAB — VARICELLA ZOSTER ANTIBODY, IGG: Varicella zoster IgG: 3124 index (ref >165)

## 2021-08-23 NOTE — Progress Notes (Signed)
Let patient know that based on blood test, she has been adequately immunized against chickenpox, measles, mumps and rubella. Please give me her form to complete.

## 2021-08-26 ENCOUNTER — Telehealth: Payer: Self-pay

## 2021-08-26 NOTE — Telephone Encounter (Signed)
Contacted pt to schedule an appt for PPD placement and read for form pt dropped off. Pt didn't answer and was unable to lvm due to vm not being setup/full

## 2021-08-26 NOTE — Telephone Encounter (Signed)
Pt returned call, and is requesting for office to fax completed form to the fax number listed on the paperwork

## 2021-08-27 NOTE — Telephone Encounter (Signed)
Please reiterate message that I placed yesterday 11/22. Pt is needing another ppd placement for form. Will contact pt to schedule a nurse visit

## 2021-08-27 NOTE — Telephone Encounter (Signed)
Contacted pt to schedule nurse visit pt didn't answer and was unable to lvm due to vm not being setup/full

## 2021-09-08 NOTE — Telephone Encounter (Signed)
Patient called in stated that she had the PPD done someplace else just need her forms completed and faxed today or tomorrow if possible. Any questions please call with questions Ph# [phone]

## 2021-09-09 NOTE — Telephone Encounter (Signed)
Faxed forms today and got a failed faxed transmission. Will retry

## 2021-09-10 NOTE — Telephone Encounter (Signed)
Form has been faxed and placed up front for pick up

## 2021-09-11 NOTE — Telephone Encounter (Signed)
Called pt made aware of forms being faxed and ready to pick up

## 2021-09-11 NOTE — Telephone Encounter (Signed)
Pt called back to make sure all the forms were faxed over.

## 2021-09-11 NOTE — Telephone Encounter (Signed)
Pt called in to update assistant with fax# she says that the fax isn't going through.    [phone] - pt would like to make sure that the fax is sent today if possible.

## 2021-09-17 ENCOUNTER — Telehealth: Payer: Self-pay | Admitting: Internal Medicine

## 2021-09-17 NOTE — Telephone Encounter (Signed)
Patient called in states needs Dr Jonny Ruiz to sign physical statement of health and email or fax to [facility]. She states Dr Laural Benes already has the info to send back too

## 2021-09-18 NOTE — Telephone Encounter (Signed)
Returned pt call pt didn't answer and was unable to lvm due to vm being full.. went up front to located forms in provider folder pt must have picked up forms. Forms were faxed on 12/7 and transmission log as received. If pt calls back pt can bring forms back up here and we can try and fax again

## 2021-09-22 ENCOUNTER — Ambulatory Visit: Payer: Self-pay | Attending: Internal Medicine | Admitting: Pharmacist

## 2021-09-22 ENCOUNTER — Other Ambulatory Visit: Payer: Self-pay

## 2021-09-22 DIAGNOSIS — Z23 Encounter for immunization: Secondary | ICD-10-CM

## 2021-09-22 NOTE — Progress Notes (Signed)
Patient presents for vaccination against hepB per orders of Dr. Johnson. Consent given. Counseling provided. No contraindications exists. Vaccine administered without incident.   Luke Van Ausdall, PharmD, BCACP, CPP Clinical Pharmacist [facility] [phone]  

## 2021-10-23 ENCOUNTER — Other Ambulatory Visit: Payer: Self-pay

## 2021-10-23 ENCOUNTER — Ambulatory Visit: Payer: BC Managed Care – PPO | Attending: Physician Assistant | Admitting: Physician Assistant

## 2021-10-23 ENCOUNTER — Encounter: Payer: Self-pay | Admitting: Physician Assistant

## 2021-10-23 VITALS — BP 100/65 | HR 97 | Resp 16 | Wt 125.4 lb

## 2021-10-23 DIAGNOSIS — B070 Plantar wart: Secondary | ICD-10-CM

## 2021-10-23 DIAGNOSIS — L84 Corns and callosities: Secondary | ICD-10-CM

## 2021-10-23 DIAGNOSIS — M79671 Pain in right foot: Secondary | ICD-10-CM | POA: Diagnosis not present

## 2021-10-23 NOTE — Progress Notes (Signed)
Patient ID: Kathy Gilbert, female   DOB: 10/01/1961, 61 y.o.   MRN: [id number]   Kathy Gilbert, is a 61 y.o. female  [id number]:[id number]  [id number]  DOB - Aug 30, 1961  Chief Complaint  Patient presents with   Foot Pain       Subjective:   Kathy Gilbert is a 61 y.o. female here today for continued foot issues.  Some tenderness with weight bearing.  She has been having trouble with this for about 1 year or more.  OTC pads and remedies not helping.     Patient has No headache, No chest pain, No abdominal pain - No Nausea, No new weakness tingling or numbness, No Cough - SOB.  No problems updated.  ALLERGIES: Allergies  Allergen Reactions   Eggs Or Egg-Derived Products Nausea And Vomiting and Rash    PAST MEDICAL HISTORY: Past Medical History:  Diagnosis Date   Acid reflux     MEDICATIONS AT HOME: Prior to Admission medications   Medication Sig Start Date End Date Taking? Authorizing Provider  acetaminophen (TYLENOL) 500 MG tablet Take 1 tablet (500 mg total) by mouth every 6 (six) hours as needed. Patient not taking: Reported on 10/23/2021 02/22/15   Rhetta Mura, MD  cetirizine-pseudoephedrine (ZYRTEC-D) 5-120 MG per tablet Take 1 tablet by mouth daily. Patient not taking: Reported on 10/23/2021 02/25/14   Servando Salina, NP  clotrimazole-betamethasone Thurmond Butts) cream Apply to affected area 2 times daily prn Patient not taking: Reported on 10/23/2021 04/11/20   Benjiman Core, MD  Multiple Vitamin (MULTIVITAMIN WITH MINERALS) TABS tablet Take 1 tablet by mouth daily. Patient not taking: Reported on 10/23/2021    [provider]    ROS: Neg HEENT Neg resp Neg cardiac Neg GI Neg GU Neg MS Neg psych Neg neuro  Objective:   Vitals:   10/23/21 1405  BP: 100/65  Pulse: 97  Resp: 16  SpO2: 96%  Weight: 125 lb 6.4 oz (56.9 kg)   Exam General appearance : Awake, alert, not in any distress. Speech Clear. Not toxic looking HEENT: Atraumatic and  Normocephalic Neck: Supple, no JVD. No cervical lymphadenopathy.  Chest: Good air entry bilaterally, CTAB.  No rales/rhonchi/wheezing CVS: S1 S2 regular, no murmurs.  Extremities: B/L Lower Ext shows no edema, both legs are warm to touch Neurology: Awake alert, and oriented X 3, CN II-XII intact, Non focal.  R foot 4 digit with corn on dorsal surface.  2 plantars warts on plantar surface.  B  capillary RF Skin: No Rash  Data Review Lab Results  Component Value Date   HGBA1C 5.6 09/03/2016    Assessment & Plan   1. Right foot pain - Ambulatory referral to Podiatry  2. Corn of toe - Ambulatory referral to Podiatry  3. Plantar wart of right foot - Ambulatory referral to Podiatry  Patient have been counseled extensively about nutrition and exercise. Other issues discussed during this visit include: low cholesterol diet, weight control and daily exercise, foot care, annual eye examinations at Ophthalmology, importance of adherence with medications and regular follow-up. We also discussed long term complications of uncontrolled diabetes and hypertension.   Return if symptoms worsen or fail to improve.  The patient was given clear instructions to go to ER or return to medical center if symptoms don't improve, worsen or new problems develop. The patient verbalized understanding. The patient was told to call to get lab results if they haven't heard anything in the next week.      Kathy Gilbert  Sharon Seller, PA-C [facility] [place], [place] [phone]   10/23/2021, 2:18 PM

## 2021-11-13 IMAGING — CR DG CHEST 2V
2 series · 2 of 2 positions shown · non-contrast
Comparison: 11/13/2007

CLINICAL DATA: Shortness of breath

EXAM:
CHEST - 2 VIEW

[w chest pa]
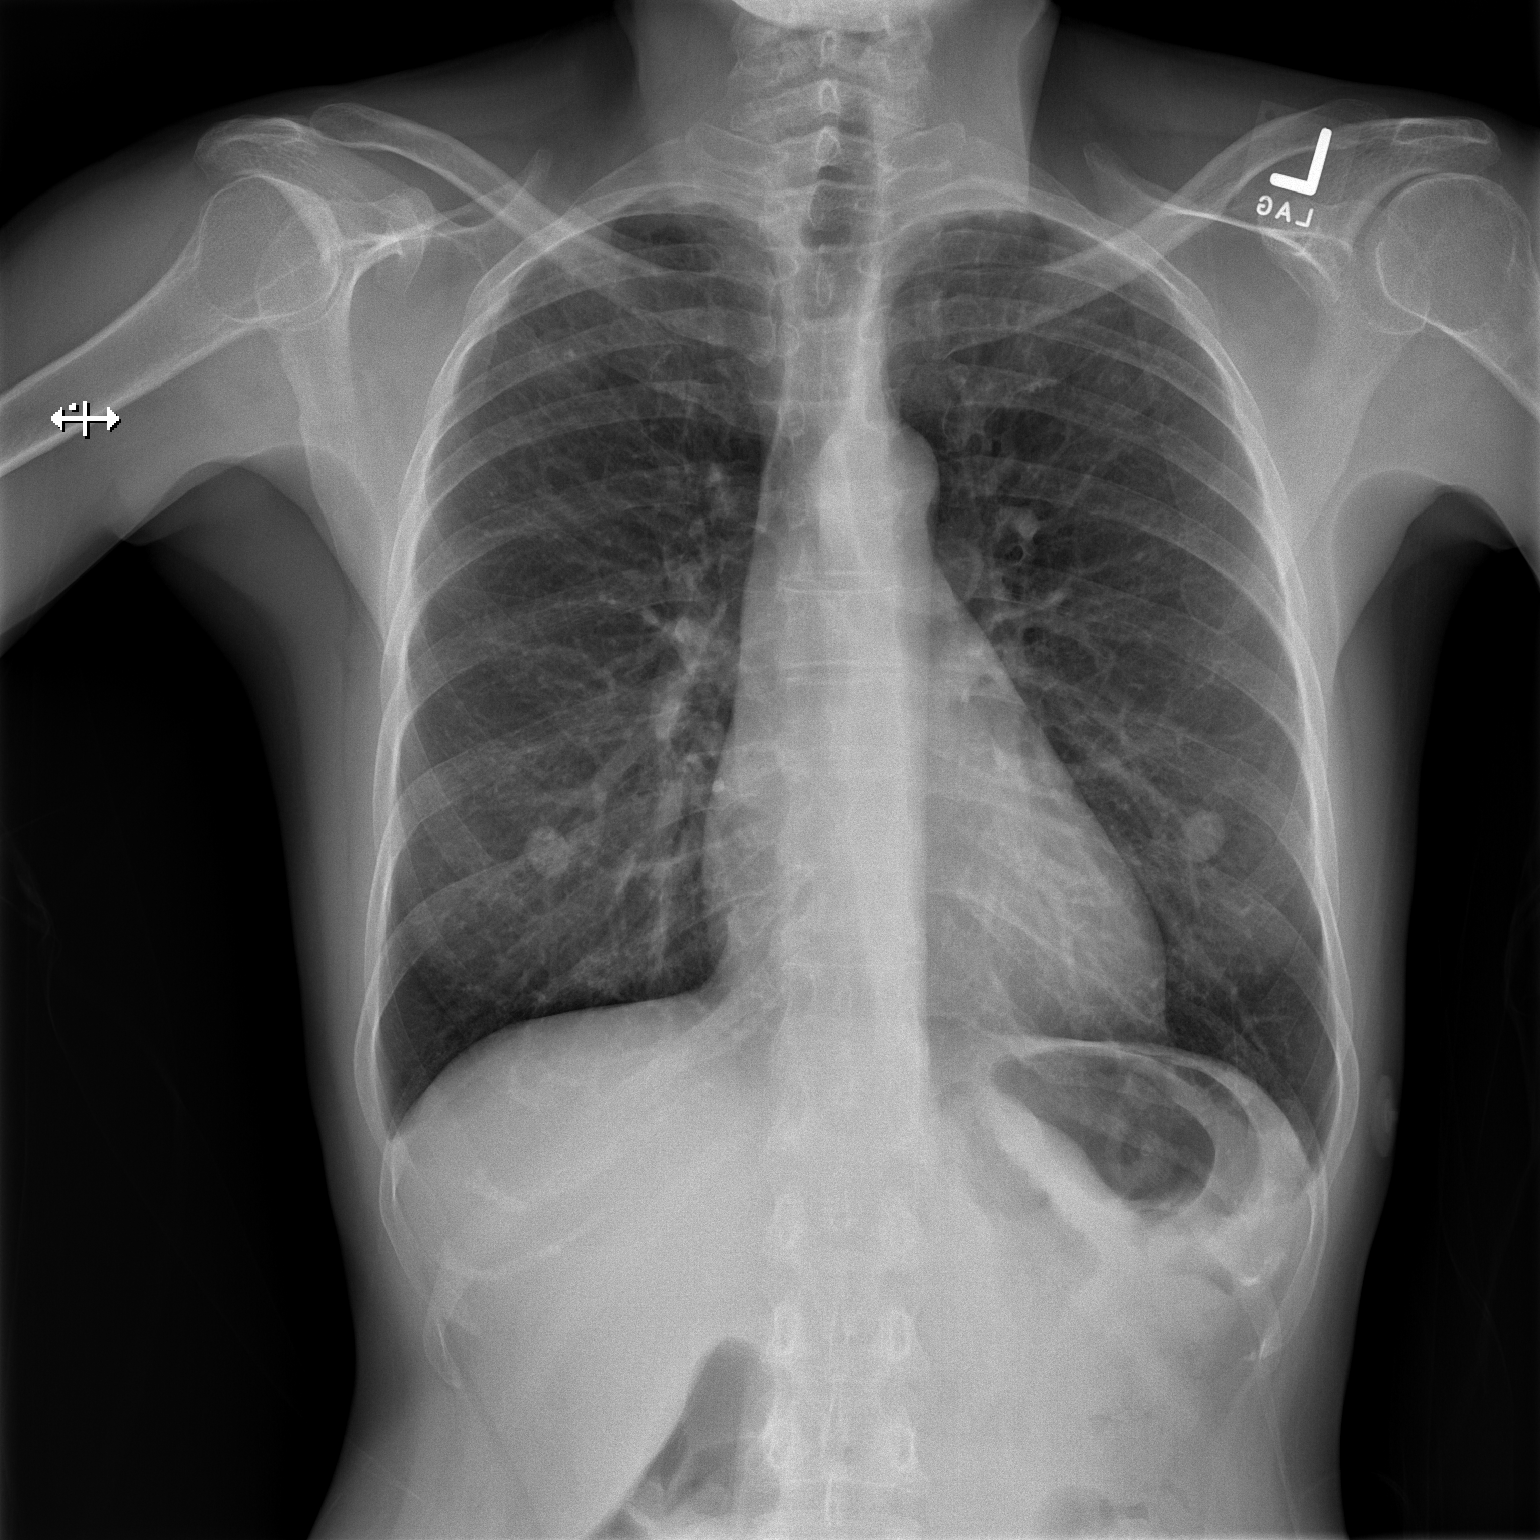

[w chest lat]
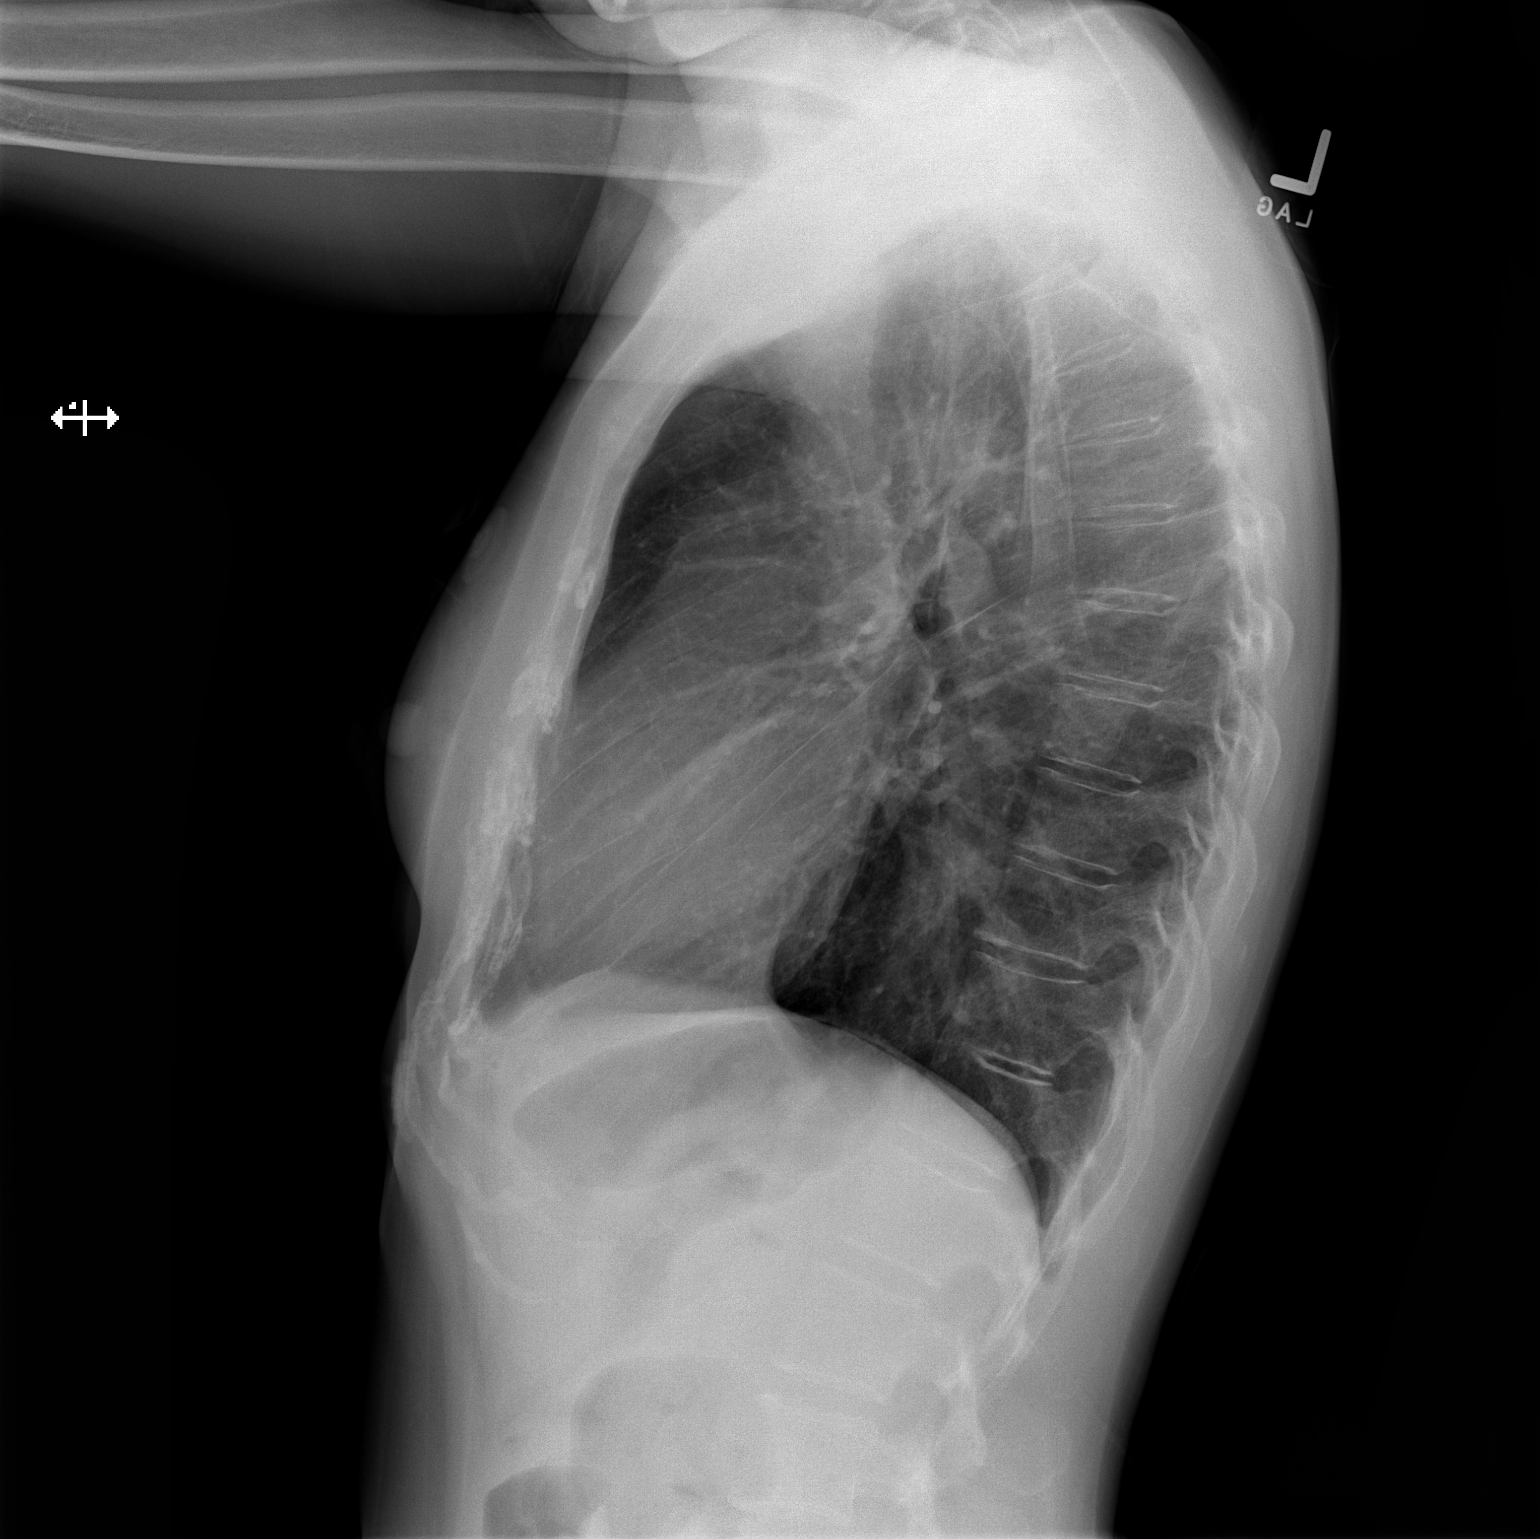

[2 of 2 positions shown; findings below may reference images not displayed]

FINDINGS: Cardiac shadow is within normal limits. Bilateral nipple shadows are
seen. The lungs are clear without focal infiltrate or sizable
effusion. No bony abnormality is seen.
IMPRESSION: No active cardiopulmonary disease.

## 2022-01-21 ENCOUNTER — Ambulatory Visit: Payer: BC Managed Care – PPO | Admitting: Podiatry

## 2022-03-05 ENCOUNTER — Telehealth: Payer: Self-pay | Admitting: Internal Medicine

## 2022-03-05 NOTE — Telephone Encounter (Signed)
Copied from [place] [id number]. Topic: Referral - Question >> Mar 04, 2022  5:00 PM Pawlus, Brayton Layman A wrote: Reason for CRM: Pt wanted to know if a referral could be sent into a different podiatry office, pt stated [facility] is too big, pt wanted a "smaller" office. Please advise.

## 2022-03-20 ENCOUNTER — Ambulatory Visit: Payer: BC Managed Care – PPO | Admitting: Podiatry

## 2022-04-02 ENCOUNTER — Encounter: Payer: Self-pay | Admitting: Podiatry

## 2022-04-02 ENCOUNTER — Ambulatory Visit: Payer: BC Managed Care – PPO | Admitting: Podiatry

## 2022-04-02 DIAGNOSIS — M2041 Other hammer toe(s) (acquired), right foot: Secondary | ICD-10-CM | POA: Diagnosis not present

## 2022-04-02 DIAGNOSIS — L84 Corns and callosities: Secondary | ICD-10-CM | POA: Diagnosis not present

## 2022-04-02 DIAGNOSIS — B070 Plantar wart: Secondary | ICD-10-CM

## 2022-04-03 NOTE — Progress Notes (Signed)
Subjective:   Patient ID: Kathy Gilbert, female   DOB: 61 y.o.   MRN: [id number]   HPI Patient presents with painful lesion right over left and states that she thinks it is a wart and that she does try Compound W which helps temporarily.  She is interested in long-term possibilities of what could be done in difficult to get an understanding from her as to how much this bothers her.  Patient does not smoke tries to be active   Review of Systems  All other systems reviewed and are negative.       Objective:  Physical Exam Vitals and nursing note reviewed.  Constitutional:      Appearance: She is well-developed.  Pulmonary:     Effort: Pulmonary effort is normal.  Musculoskeletal:        General: Normal range of motion.  Skin:    General: Skin is warm.  Neurological:     Mental Status: She is alert.     Neurovascular status found to be intact muscle strength was found to be adequate range of motion adequate.  Patient is found to have lesion right over left with keratotic tissue deep inside of it that is painful when pressed with a lucent core but also pain to lateral pressure.  Patient does have digital deformities of the lesser toes right over left with prominent metatarsal head     Assessment:  Difficult to say as to whether or not this is due to foot structure versus a wart versus porokeratotic type lesion     Plan:  H&P all conditions reviewed in today [facility] sterile debridement was recommended but patient is refusing treatment I spent a great deal of time trying to go over with her a simple debridement of tissue with application of medication to help reduce the stress on the area.  Patient is refusing this treatment and after extensive

## 2022-05-07 ENCOUNTER — Ambulatory Visit: Payer: BC Managed Care – PPO | Attending: Internal Medicine | Admitting: Pharmacist

## 2022-05-07 DIAGNOSIS — Z23 Encounter for immunization: Secondary | ICD-10-CM | POA: Diagnosis not present

## 2022-05-07 NOTE — Progress Notes (Signed)
Patient presents for vaccination against Hep B per orders of Dr. Laural Benes. Consent given. Counseling provided. No contraindications exists. Vaccine administered without incident.   Time spent: 15 minutes  Butch Penny, PharmD, [place], CPP Clinical Pharmacist [facility] [phone]

## 2022-05-08 ENCOUNTER — Telehealth: Payer: Self-pay | Admitting: Emergency Medicine

## 2022-05-08 NOTE — Telephone Encounter (Signed)
Copied from CRM [id number]. Topic: General - Other >> May 08, 2022 10:43 AM Franchot Heidelberg wrote: Reason for CRM: Pt is requesting a doctor's note to limit her sun exposure to two hours a day. She needs this for work Best contact: [phone]

## 2022-05-12 NOTE — Telephone Encounter (Signed)
RTC spoke w/ pt in regards to scheduling phone appt. In order to receive note needed. Pt states appt and note no longer needed. Informed pt info will updated in her chart. Pt expressed understanding. ------DD,RMA

## 2022-08-10 ENCOUNTER — Encounter: Payer: BC Managed Care – PPO | Admitting: Internal Medicine

## 2022-08-10 ENCOUNTER — Other Ambulatory Visit: Payer: BC Managed Care – PPO

## 2022-10-01 ENCOUNTER — Ambulatory Visit: Payer: BC Managed Care – PPO | Attending: Internal Medicine | Admitting: Internal Medicine

## 2022-10-01 ENCOUNTER — Encounter: Payer: Self-pay | Admitting: Internal Medicine

## 2022-10-01 VITALS — BP 120/77 | HR 81 | Temp 97.7°F | Ht 63.0 in | Wt 132.8 lb

## 2022-10-01 DIAGNOSIS — Z1231 Encounter for screening mammogram for malignant neoplasm of breast: Secondary | ICD-10-CM

## 2022-10-01 DIAGNOSIS — Z0000 Encounter for general adult medical examination without abnormal findings: Secondary | ICD-10-CM

## 2022-10-01 DIAGNOSIS — Z1211 Encounter for screening for malignant neoplasm of colon: Secondary | ICD-10-CM

## 2022-10-01 DIAGNOSIS — Z23 Encounter for immunization: Secondary | ICD-10-CM | POA: Diagnosis not present

## 2022-10-01 NOTE — Progress Notes (Signed)
Does not want to do PAP

## 2022-10-01 NOTE — Progress Notes (Signed)
Patient ID: Kathy Gilbert, female    DOB: 1960/11/08  MRN: [id number]  CC: Annual Exam   Subjective: Kathy Gilbert is a 61 y.o. female who presents for annual exam Her concerns today include:   HM: due for MMG and PAP.  Declines PAP Agree to flu shot but gives history of egg allergy where she has a rash Due for colon CA screen.  She prefers to have colonoscopy.     Patient Active Problem List   Diagnosis Date Noted   Screening breast examination 11/09/2019   Vitamin D deficiency 09/07/2016   PARANOIA 11/30/2008   Headache above the eye region 11/30/2008   VISUAL ACUITY, DECREASED 06/20/2008   GERD 06/20/2008   HEMORRHOIDS, INTERNAL 04/17/2004   FIBROCYSTIC BREAST DISEASE 10/31/1997     Current Outpatient Medications on File Prior to Visit  Medication Sig Dispense Refill   cetirizine-pseudoephedrine (ZYRTEC-D) 5-120 MG per tablet Take 1 tablet by mouth daily. 15 tablet 0   clotrimazole-betamethasone (LOTRISONE) cream Apply to affected area 2 times daily prn 15 g 0   Multiple Vitamin (MULTIVITAMIN WITH MINERALS) TABS tablet Take 1 tablet by mouth daily.     acetaminophen (TYLENOL) 500 MG tablet Take 1 tablet (500 mg total) by mouth every 6 (six) hours as needed. (Patient not taking: Reported on 10/23/2021) 30 tablet 0   No current facility-administered medications on file prior to visit.    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   Highest education level: Master's degree (e.g., MA, MS, MEng, MEd, MSW, MBA)  Occupational History   Occupation: unemployed   Occupation: Architectural technologist  Tobacco Use   Smoking status: Never   Smokeless tobacco: Never  Vaping Use   Vaping Use: Never used  Substance and Sexual Activity   Alcohol use: No   Drug use: No   Sexual  activity: Never  Other Topics Concern   Not on file  Social History Narrative   Not on file   Social Determinants of Health   Financial Resource Strain: Not on file  Food Insecurity: Not on file  Transportation Needs: Unknown (11/09/2019)   PRAPARE - Administrator, Civil Service (Medical): Not on file    Lack of Transportation (Non-Medical): No  Physical Activity: Not on file  Stress: Not on file  Social Connections: Not on file  Intimate Partner Violence: Not on file    Family History  Problem Relation Age of Onset   Diabetes Mother    Hypertension Mother     Past Surgical History:  Procedure Laterality Date   BREAST SURGERY      ROS: Review of Systems  Constitutional:        Works as a Geologist, engineering.  Does a lot of walking at school. No exercise outside of school.    HENT:  Negative for hearing loss, sore throat and trouble swallowing.   Eyes:        Last eye exam was over 2 years ago.  She has problems seeing things close up and far away.  She plans to call and schedule an eye exam.  Respiratory:  Negative for cough.   Cardiovascular:  Negative for chest pain and palpitations.  Gastrointestinal:  No blood in stools.  Genitourinary:  Negative for difficulty urinating.       Pt is menopausal.  No hot flashes   Negative except as stated above  PHYSICAL EXAM: BP 120/77   Pulse 81   Temp 97.7 F (36.5 C) (Oral)   Ht 5\' 3"  (1.6 m)   Wt 132 lb 12.8 oz (60.2 kg)   LMP 04/21/2016 (Approximate)   SpO2 100%   BMI 23.52 kg/m   Physical Exam  General appearance - alert, well appearing, middle age AAF and in no distress Mental status -patient with quiet demeanor and flat affect Eyes - pupils equal and reactive, extraocular eye movements intact Ears - bilateral TM's and external ear canals normal Nose - normal and patent, no erythema, discharge or polyps Mouth - mucous membranes moist, pharynx normal without lesions Neck - supple, no  significant adenopathy Lymphatics - no palpable lymphadenopathy, no hepatosplenomegaly Chest - clear to auscultation, no wheezes, rales or rhonchi, symmetric air entry Heart - normal rate, regular rhythm, normal S1, S2, no murmurs, rubs, clicks or gallops Abdomen - soft, nontender, nondistended, no masses or organomegaly Breasts - pt declined Extremities - peripheral pulses normal, no pedal edema, no clubbing or cyanosis     10/01/2022    3:19 PM 10/23/2021    2:08 PM 08/21/2021   10:44 AM  Depression screen PHQ 2/9  Decreased Interest 0 0 0  Down, Depressed, Hopeless 0 0 0  PHQ - 2 Score 0 0 0  Altered sleeping 0  0  Tired, decreased energy 0  1  Change in appetite 0  0  Feeling bad or failure about yourself  0  0  Trouble concentrating 1  0  Moving slowly or fidgety/restless 0  0  Suicidal thoughts 0  0  PHQ-9 Score 1  1  Difficult doing work/chores   Not difficult at all       Latest Ref Rng & Units 05/16/2021   11:19 AM 10/04/2019   11:40 AM 08/23/2017    3:37 PM  CMP  Glucose 65 - 99 mg/dL 84  76  67   BUN 6 - 24 mg/dL 10  14  14    Creatinine 0.57 - 1.00 mg/dL 08/25/2017   1.93   Sodium 134 - 144 mmol/L 145  143  144   Potassium 3.5 - 5.2 mmol/L 4.6  4.4  4.1   Chloride 96 - 106 mmol/L 107  104  104   CO2 20 - 29 mmol/L 23  25  25    Calcium 8.7 - 10.2 mg/dL 9.2  9.4  9.4   Total Protein 6.0 - 8.5 g/dL 7.4  7.1  7.3   Total Bilirubin 0.0 - 1.2 mg/dL 0.6  0.4  0.3   Alkaline Phos 44 - 121 IU/L 80  115  78   AST 0 - 40 IU/L 20  34  25   ALT 0 - 32 IU/L 17  53  21    Lipid Panel     Component Value Date/Time   CHOL 176 05/16/2021 1119   TRIG 43 05/16/2021 1119   HDL 88 05/16/2021 1119   CHOLHDL 2.0 05/16/2021 1119   LDLCALC 79 05/16/2021 1119    CBC    Component Value Date/Time   WBC 5.1 05/16/2021 1119   WBC 4.8 09/03/2016 1615   RBC 4.40 05/16/2021 1119   RBC 4.37 09/03/2016 1615   HGB 12.9 05/16/2021 1119   HCT 39.2 05/16/2021 1119  PLT 189  05/16/2021 1119   MCV 89 05/16/2021 1119   MCH 29.3 05/16/2021 1119   MCH 29.5 09/03/2016 1615   MCHC 32.9 05/16/2021 1119   MCHC 32.9 09/03/2016 1615   RDW 13.2 05/16/2021 1119   LYMPHSABS 1,920 09/03/2016 1615   MONOABS 336 09/03/2016 1615   EOSABS 96 09/03/2016 1615   BASOSABS 0 09/03/2016 1615    ASSESSMENT AND PLAN:  1. Annual physical exam Well adult Discussed importance of regular exercise.  Advised the goal is to get in about 150 minutes/week total of moderate intensity exercise. Discussed on encourage healthy eating habits. - CBC - Comprehensive metabolic panel - Lipid panel  2. Screening for colon cancer - Ambulatory referral to Gastroenterology  3. Encounter for screening mammogram for malignant neoplasm of breast - MM Digital Screening; Future  4. Need for influenza vaccination Advise to get [facility] from [facility] or [facility].  We do not carry this but it is the one that can be used in patients who have egg allergy.    Patient was given the opportunity to ask questions.  Patient verbalized understanding of the plan and was able to repeat key elements of the plan.   This documentation was completed using Paediatric nurse.  Any transcriptional errors are unintentional.  No orders of the defined types were placed in this encounter.    Requested Prescriptions    No prescriptions requested or ordered in this encounter    No follow-ups on file.  Jonah Blue, MD, FACP

## 2022-10-01 NOTE — Patient Instructions (Signed)
[facility] to see if they have the flu vaccine known as Flublok.  Preventive Care 80-59 Years Old, Female Preventive care refers to lifestyle choices and visits with your health care provider that can promote health and wellness. Preventive care visits are also called wellness exams. What can I expect for my preventive care visit? Counseling Your health care provider may ask you questions about your: Medical history, including: Past medical problems. Family medical history. Pregnancy history. Current health, including: Menstrual cycle. Method of birth control. Emotional well-being. Home life and relationship well-being. Sexual activity and sexual health. Lifestyle, including: Alcohol, nicotine or tobacco, and drug use. Access to firearms. Diet, exercise, and sleep habits. Work and work Statistician. Sunscreen use. Safety issues such as seatbelt and bike helmet use. Physical exam Your health care provider will check your: Height and weight. These may be used to calculate your BMI (body mass index). BMI is a measurement that tells if you are at a healthy weight. Waist circumference. This measures the distance around your waistline. This measurement also tells if you are at a healthy weight and may help predict your risk of certain diseases, such as type 2 diabetes and high blood pressure. Heart rate and blood pressure. Body temperature. Skin for abnormal spots. What immunizations do I need?  Vaccines are usually given at various ages, according to a schedule. Your health care provider will recommend vaccines for you based on your age, medical history, and lifestyle or other factors, such as travel or where you work. What tests do I need? Screening Your health care provider may recommend screening tests for certain conditions. This may include: Lipid and cholesterol levels. Diabetes screening. This is done by checking your blood sugar (glucose) after you have not eaten for a while  (fasting). Pelvic exam and Pap test. Hepatitis B test. Hepatitis C test. HIV (human immunodeficiency virus) test. STI (sexually transmitted infection) testing, if you are at risk. Lung cancer screening. Colorectal cancer screening. Mammogram. Talk with your health care provider about when you should start having regular mammograms. This may depend on whether you have a family history of breast cancer. BRCA-related cancer screening. This may be done if you have a family history of breast, ovarian, tubal, or peritoneal cancers. Bone density scan. This is done to screen for osteoporosis. Talk with your health care provider about your test results, treatment options, and if necessary, the need for more tests. Follow these instructions at home: Eating and drinking  Eat a diet that includes fresh fruits and vegetables, whole grains, lean protein, and low-fat dairy products. Take vitamin and mineral supplements as recommended by your health care provider. Do not drink alcohol if: Your health care provider tells you not to drink. You are pregnant, may be pregnant, or are planning to become pregnant. If you drink alcohol: Limit how much you have to 0-1 drink a day. Know how much alcohol is in your drink. In the [place] one drink equals one 12 oz bottle of beer (355 mL), one 5 oz glass of wine (148 mL), or one 1 oz glass of hard liquor (44 mL). Lifestyle Brush your teeth every morning and night with fluoride toothpaste. Floss one time each day. Exercise for at least 30 minutes 5 or more days each week. Do not use any products that contain nicotine or tobacco. These products include cigarettes, chewing tobacco, and vaping devices, such as e-cigarettes. If you need help quitting, ask your health care provider. Do not use drugs. If you are  sexually active, practice safe sex. Use a condom or other form of protection to prevent STIs. If you do not wish to become pregnant, use a form of birth control. If  you plan to become pregnant, see your health care provider for a prepregnancy visit. Take aspirin only as told by your health care provider. Make sure that you understand how much to take and what form to take. Work with your health care provider to find out whether it is safe and beneficial for you to take aspirin daily. Find healthy ways to manage stress, such as: Meditation, yoga, or listening to music. Journaling. Talking to a trusted person. Spending time with friends and family. Minimize exposure to UV radiation to reduce your risk of skin cancer. Safety Always wear your seat belt while driving or riding in a vehicle. Do not drive: If you have been drinking alcohol. Do not ride with someone who has been drinking. When you are tired or distracted. While texting. If you have been using any mind-altering substances or drugs. Wear a helmet and other protective equipment during sports activities. If you have firearms in your house, make sure you follow all gun safety procedures. Seek help if you have been physically or sexually abused. What's next? Visit your health care provider once a year for an annual wellness visit. Ask your health care provider how often you should have your eyes and teeth checked. Stay up to date on all vaccines. This information is not intended to replace advice given to you by your health care provider. Make sure you discuss any questions you have with your health care provider. Document Revised: 03/19/2021 Document Reviewed: 03/19/2021 Elsevier Patient Education  [place].

## 2022-10-02 LAB — CBC
Hematocrit: 36.9 % (ref 34.0–46.6)
Hemoglobin: 12.1 g/dL (ref 11.1–15.9)
MCH: 28.8 pg (ref 26.6–33.0)
MCHC: 32.8 g/dL (ref 31.5–35.7)
MCV: 88 fL (ref 79–97)
Platelets: 210 10*3/uL (ref 150–450)
RBC: 4.2 x10E6/uL (ref 3.77–5.28)
RDW: 13 % (ref 11.7–15.4)
WBC: 7.1 10*3/uL (ref 3.4–10.8)

## 2022-10-02 LAB — COMPREHENSIVE METABOLIC PANEL
ALT: 58 IU/L — ABNORMAL HIGH (ref 0–32)
AST: 40 IU/L (ref 0–40)
Albumin/Globulin Ratio: 1.6 (ref 1.2–2.2)
Albumin: 4.2 g/dL (ref 3.8–4.9)
Alkaline Phosphatase: 107 IU/L (ref 44–121)
BUN/Creatinine Ratio: 18 (ref 9–23)
BUN: 11 mg/dL (ref 6–24)
Bilirubin Total: 0.4 mg/dL (ref 0.0–1.2)
CO2: 23 mmol/L (ref 20–29)
Calcium: 9.5 mg/dL (ref 8.7–10.2)
Chloride: 102 mmol/L (ref 96–106)
Creatinine, Ser: 0.6 mg/dL (ref 0.57–1.00)
Globulin, Total: 2.7 g/dL (ref 1.5–4.5)
Glucose: 81 mg/dL (ref 70–99)
Potassium: 3.9 mmol/L (ref 3.5–5.2)
Sodium: 141 mmol/L (ref 134–144)
Total Protein: 6.9 g/dL (ref 6.0–8.5)
eGFR: 103 mL/min/{1.73_m2} (ref >59)

## 2022-10-02 LAB — LIPID PANEL
Chol/HDL Ratio: 2.1 ratio (ref 0.0–4.4)
Cholesterol, Total: 198 mg/dL (ref 100–199)
HDL: 95 mg/dL (ref >39)
LDL Chol Calc (NIH): 96 mg/dL (ref 0–99)
Triglycerides: 35 mg/dL (ref 0–149)
VLDL Cholesterol Cal: 7 mg/dL (ref 5–40)

## 2023-02-16 ENCOUNTER — Ambulatory Visit: Payer: BC Managed Care – PPO

## 2023-03-24 ENCOUNTER — Encounter: Payer: Self-pay | Admitting: Gastroenterology

## 2023-04-05 ENCOUNTER — Ambulatory Visit: Payer: Self-pay

## 2023-04-05 NOTE — Telephone Encounter (Signed)
  Chief Complaint: Swollen ankles. Right more swollen Symptoms: above Frequency: Friday Pertinent Negatives: Patient denies chest pain sob Disposition: [] ED /[x] Urgent Care (no appt availability in office) / [] Appointment(In office/virtual)/ []  [place] [facility]/ [] Home Care/ [] Refused Recommended Disposition /[] [place] [facility]/ []  Follow-up with PCP Additional Notes: PT states that her feet/ ankles started swelling Thursday or Friday. Right foot is much more swollen. No appts in office. Pt will go to UC for care. Pt wants to have her protein checked at next lab. Pt states that she has researched her elevated ALT and low protein may cause this. PT also states she will start a multi - vitamin.   Summary: Swelling in ankles   Ankles are swelling, 3-4 days  Best contact: [phone]     Reason for Disposition  [1] Thigh, calf, or ankle swelling AND [2] bilateral AND [3] 1 side is more swollen  Answer Assessment - Initial Assessment Questions 1. LOCATION: "Which ankle is swollen?" "Where is the swelling?"     More the right ankle 2. ONSET: "When did the swelling start?"     Friday 3. SWELLING: "How bad is the swelling?" Or, "How large is it?" (e.g., mild, moderate, severe; size of localized swelling)    - NONE: No joint swelling.   - LOCALIZED: Localized; small area of puffy or swollen skin (e.g., insect bite, skin irritation).   - MILD: Joint looks or feels mildly swollen or puffy.   - MODERATE: Swollen; interferes with normal activities (e.g., work or school); decreased range of movement; may be limping.   - SEVERE: Very swollen; can't move swollen joint at all; limping a lot or unable to walk.     Moderate 4. PAIN: "Is there any pain?" If Yes, ask: "How bad is it?" (Scale 1-10; or mild, moderate, severe)   - NONE (0): no pain.   - MILD (1-3): doesn't interfere with normal activities.    - MODERATE (4-7): interferes with normal activities (e.g., work or school) or  awakens from sleep, limping.    - SEVERE (8-10): excruciating pain, unable to do any normal activities, unable to walk.      No pain 5. CAUSE: "What do you think caused the ankle swelling?"     no 6. OTHER SYMPTOMS: "Do you have any other symptoms?" (e.g., fever, chest pain, difficulty breathing, calf pain)     no  Protocols used: Ankle Swelling-A-AH

## 2023-04-05 NOTE — Telephone Encounter (Signed)
Summary: Swelling in ankles   Ankles are swelling, 3-4 days  Best contact: [phone]      Called pt - Left message on machine to return our call.

## 2023-04-05 NOTE — Telephone Encounter (Signed)
noted 

## 2023-04-09 ENCOUNTER — Ambulatory Visit: Payer: BC Managed Care – PPO

## 2023-04-09 ENCOUNTER — Ambulatory Visit
Admission: RE | Admit: 2023-04-09 | Discharge: 2023-04-09 | Disposition: A | Discharge status: Home / Self Care | Payer: BC Managed Care – PPO | Source: Ambulatory Visit | Attending: Internal Medicine | Admitting: Internal Medicine

## 2023-04-09 DIAGNOSIS — Z1231 Encounter for screening mammogram for malignant neoplasm of breast: Secondary | ICD-10-CM

## 2023-04-22 ENCOUNTER — Telehealth: Payer: Self-pay | Admitting: Internal Medicine

## 2023-04-22 DIAGNOSIS — Z1231 Encounter for screening mammogram for malignant neoplasm of breast: Secondary | ICD-10-CM

## 2023-04-22 NOTE — Telephone Encounter (Signed)
Copied from CRM [id number]. Topic: General - Inquiry >> Apr 22, 2023  4:07 PM Haroldine Laws wrote: Reason for CRM: pt called asking if Dr. Laural Benes would put in an order for  mammogram for next year so she can go ahead a schedule it.

## 2023-04-22 NOTE — Addendum Note (Signed)
Addended by: Jonah Blue B on: 04/22/2023 06:03 PM   Modules accepted: Orders

## 2023-04-23 NOTE — Telephone Encounter (Signed)
Called but no answer. LVM to call back.  

## 2023-04-26 ENCOUNTER — Encounter: Payer: Self-pay | Admitting: Gastroenterology

## 2023-04-26 ENCOUNTER — Ambulatory Visit (AMBULATORY_SURGERY_CENTER): Payer: BC Managed Care – PPO

## 2023-04-26 VITALS — Ht 64.0 in | Wt 125.0 lb

## 2023-04-26 DIAGNOSIS — Z1211 Encounter for screening for malignant neoplasm of colon: Secondary | ICD-10-CM

## 2023-04-26 MED ORDER — NA SULFATE-K SULFATE-MG SULF 17.5-3.13-1.6 GM/177ML PO SOLN: 1.0000 | Freq: Once | ORAL | 0 refills | Status: AC

## 2023-04-26 NOTE — Telephone Encounter (Signed)
Called but no answer. Informed that referral has been sent for a mammogram.

## 2023-04-26 NOTE — Progress Notes (Signed)
Pre visit completed via phone call; Patient verified name, DOB, and address;  No soy allergy known to patient ---patient reports an intolerance to egg derived products- patient received an immunization and had a gi upset with it, however, patient reports she is able to eat cakes/pies that have eggs cooked in them without any reaction;   No issues known to pt with past sedation with any surgeries or procedures; Patient denies ever being told they had issues or difficulty with intubation;  No FH of Malignant Hyperthermia; Pt is not on diet pills; Pt is not on home 02;  Pt is not on blood thinners;  Pt reports issues with constipation- patient reports she has IBS-patient reports she "will change up my diet"; patient reports she will use laxatives sometimes "If I feel like I need to"; patient advised to increase oral fluids, activity as tolerated, increase fruits/veggies as allowed;   No A fib or A flutter;  Have any cardiac testing pending--NO Insurance verified during PV appt--- BCBS State  Pt can ambulate without assistance;  Pt denies use of chewing tobacco Discussed diabetic/weight loss medication holds; Discussed NSAID holds; Checked BMI to be less than 50; Pt instructed to use Singlecare.com or GoodRx for a price reduction on prep  Patient's chart reviewed by Cathlyn Parsons CNRA prior to previsit and patient appropriate for the [facility].  Pre visit completed and red dot placed by patient's name on their procedure day (on provider's schedule).    Instructions sent to patient via MyChart per her request;

## 2023-04-30 ENCOUNTER — Ambulatory Visit: Payer: Self-pay

## 2023-04-30 NOTE — Telephone Encounter (Signed)
Message from [place] T sent at 04/30/2023  2:45 PM EDT  Summary: swollen feet   Patient called stated she has swelling in both feet and an irritating rash on the inside of both her ankles. Please f/u with patient as she wants to be seen but no appt until mid Sept         Chief Complaint: mild bilateral feet swelling Symptoms: rash hyperpigmented per description rash "eczema" Frequency: 2 weeks  Pertinent Negatives: Patient denies calf pain, redness, warmth, painful to touch Disposition: [] ED /[] Urgent Care (no appt availability in office) / [x] Appointment(In office/virtual)/ []  [place] [facility]/ [] Home Care/ [] Refused Recommended Disposition /[] [place] [facility]/ []  Follow-up with PCP Additional Notes: appt schedule for tomorrow with Seymour Bars FNP   Reason for Disposition  [1] MILD swelling of both ankles (i.e., pedal edema) AND [2] is a chronic symptom (recurrent or ongoing AND present > 4 weeks)  Answer Assessment - Initial Assessment Questions 1. ONSET: "When did the swelling start?" (e.g., minutes, hours, days)     2 weeks ago  2. LOCATION: "What part of the leg is swollen?"  "Are both legs swollen or just one leg?"     Feet  3. SEVERITY: "How bad is the swelling?" (e.g., localized; mild, moderate, severe)   - Localized: Small area of swelling localized to one leg.   - MILD pedal edema: Swelling limited to foot and ankle, pitting edema < 1/4 inch (6 mm) deep, rest and elevation eliminate most or all swelling.   - MODERATE edema: Swelling of lower leg to knee, pitting edema > 1/4 inch (6 mm) deep, rest and elevation only partially reduce swelling.   - SEVERE edema: Swelling extends above knee, facial or hand swelling present.      mild 4. REDNESS: "Does the swelling look red or infected?"     Rash to bilateral ankles, itches 5. PAIN: "Is the swelling painful to touch?" If Yes, ask: "How painful is it?"   (Scale 1-10; mild, moderate or severe)     No  6. FEVER: "Do  you have a fever?" If Yes, ask: "What is it, how was it measured, and when did it start?"      no 7. CAUSE: "What do you think is causing the leg swelling?"     no 8. MEDICAL HISTORY: "Do you have a history of blood clots (e.g., DVT), cancer, heart failure, kidney disease, or liver failure?"     No- wants hepatic function to check enzymes 9. RECURRENT SYMPTOM: "Have you had leg swelling before?" If Yes, ask: "When was the last time?" "What happened that time?"     Yes-   10. OTHER SYMPTOMS: "Do you have any other symptoms?" (e.g., chest pain, difficulty breathing)       no  Protocols used: Leg Swelling and Edema-A-AH

## 2023-05-01 ENCOUNTER — Ambulatory Visit (HOSPITAL_COMMUNITY)
Admission: EM | Admit: 2023-05-01 | Discharge: 2023-05-01 | Discharge status: Against Medical Advice | Payer: BC Managed Care – PPO | Source: Home / Self Care

## 2023-05-01 ENCOUNTER — Ambulatory Visit: Payer: BC Managed Care – PPO | Admitting: Family

## 2023-05-03 ENCOUNTER — Ambulatory Visit: Payer: BC Managed Care – PPO | Admitting: Family

## 2023-05-04 ENCOUNTER — Ambulatory Visit (HOSPITAL_COMMUNITY)
Admission: EM | Admit: 2023-05-04 | Discharge: 2023-05-04 | Disposition: A | Discharge status: Home / Self Care | Payer: BC Managed Care – PPO | Attending: Family Medicine | Admitting: Family Medicine

## 2023-05-04 ENCOUNTER — Encounter (HOSPITAL_COMMUNITY): Payer: Self-pay | Admitting: Emergency Medicine

## 2023-05-04 DIAGNOSIS — R600 Localized edema: Secondary | ICD-10-CM | POA: Insufficient documentation

## 2023-05-04 LAB — CBC
HCT: 38.4 % (ref 36.0–46.0)
Hemoglobin: 12.1 g/dL (ref 12.0–15.0)
MCH: 28.3 pg (ref 26.0–34.0)
MCHC: 31.5 g/dL (ref 30.0–36.0)
MCV: 89.7 fL (ref 80.0–100.0)
Platelets: 262 10*3/uL (ref 150–400)
RBC: 4.28 MIL/uL (ref 3.87–5.11)
RDW: 12.7 % (ref 11.5–15.5)
WBC: 6.4 10*3/uL (ref 4.0–10.5)
nRBC: 0 % (ref 0.0–0.2)

## 2023-05-04 LAB — COMPREHENSIVE METABOLIC PANEL
ALT: 20 U/L (ref 0–44)
AST: 23 U/L (ref 15–41)
Albumin: 3.7 g/dL (ref 3.5–5.0)
Alkaline Phosphatase: 82 U/L (ref 38–126)
Anion gap: 6 (ref 5–15)
BUN: 8 mg/dL (ref 6–20)
CO2: 28 mmol/L (ref 22–32)
Calcium: 9.2 mg/dL (ref 8.9–10.3)
Chloride: 104 mmol/L (ref 98–111)
Creatinine, Ser: 0.7 mg/dL (ref 0.44–1.00)
GFR, Estimated: >60 mL/min (ref >60)
Glucose, Bld: 82 mg/dL (ref 70–99)
Potassium: 4.1 mmol/L (ref 3.5–5.1)
Sodium: 138 mmol/L (ref 135–145)
Total Bilirubin: 0.5 mg/dL (ref 0.3–1.2)
Total Protein: 7.3 g/dL (ref 6.5–8.1)

## 2023-05-04 LAB — TSH: TSH: 1.884 u[IU]/mL (ref 0.350–4.500)

## 2023-05-04 NOTE — ED Provider Notes (Signed)
[facility]    CSN: [id number] Arrival date & time: 05/04/23  1218      History   Chief Complaint Chief Complaint  Patient presents with   Foot Swelling    HPI Kathy Gilbert is a 62 y.o. female.   HPI Here for bilateral foot and leg swelling.  This has been going on for about 2 weeks.  She has not had any upper respiratory symptoms or cough or shortness of breath.  No dysuria or hematuria.  No abdominal pain and no vomiting or diarrhea or nausea.  The feet and ankles are a little tender from swelling.  Of note her blood pressure at triage is 90 systolic.  On review of prior vital signs, her blood pressure is usually 120s systolic Past Medical History:  Diagnosis Date   Acid reflux    hx of   Anemia    hx of   Anxiety    NOT on meds at this time (04/26/2023)   Paranoia (HCC)    Seasonal allergies    Vitamin D deficiency     Patient Active Problem List   Diagnosis Date Noted   Screening breast examination 11/09/2019   Vitamin D deficiency 09/07/2016   PARANOIA 11/30/2008   Headache above the eye region 11/30/2008   VISUAL ACUITY, DECREASED 06/20/2008   GERD 06/20/2008   HEMORRHOIDS, INTERNAL 04/17/2004   FIBROCYSTIC BREAST DISEASE 10/31/1997    Past Surgical History:  Procedure Laterality Date   BREAST BIOPSY      OB History   No obstetric history on file.      Home Medications    Prior to Admission medications   Medication Sig Start Date End Date Taking? Authorizing Provider  acetaminophen (TYLENOL) 500 MG tablet Take 1 tablet (500 mg total) by mouth every 6 (six) hours as needed. 02/22/15   Rhetta Mura, MD    Family History Family History  Problem Relation Age of Onset   Diabetes Mother    Hypertension Mother    Breast cancer Neg Hx    Colon polyps Neg Hx    Colon cancer Neg Hx    Esophageal cancer Neg Hx    Rectal cancer Neg Hx    Stomach cancer Neg Hx     Social History Social History   Tobacco Use   Smoking  status: Never   Smokeless tobacco: Never  Vaping Use   Vaping status: Never Used  Substance Use Topics   Alcohol use: No   Drug use: No     Allergies   Omeprazole and Egg-derived products   Review of Systems Review of Systems   Physical Exam Triage Vital Signs ED Triage Vitals  Encounter Vitals Group     BP 05/04/23 1248 (!) 90/55     Systolic BP Percentile --      Diastolic BP Percentile --      Pulse Rate 05/04/23 1248 77     Resp 05/04/23 1248 16     Temp 05/04/23 1248 97.7 F (36.5 C)     Temp Source 05/04/23 1248 Oral     SpO2 05/04/23 1248 98 %     Weight --      Height --      Head Circumference --      Peak Flow --      Pain Score 05/04/23 1247 0     Pain Loc --      Pain Education --      Exclude from [employer]  Chart --    No data found.  Updated Vital Signs BP (!) 90/55 (BP Location: Right Arm)   Pulse 77   Temp 97.7 F (36.5 C) (Oral)   Resp 16   LMP 04/21/2016 (Approximate)   SpO2 98%   Visual Acuity Right Eye Distance:   Left Eye Distance:   Bilateral Distance:    Right Eye Near:   Left Eye Near:    Bilateral Near:     Physical Exam Vitals reviewed.  Constitutional:      General: She is not in acute distress.    Appearance: She is not ill-appearing, toxic-appearing or diaphoretic.  HENT:     Mouth/Throat:     Mouth: Mucous membranes are moist.  Eyes:     Extraocular Movements: Extraocular movements intact.     Conjunctiva/sclera: Conjunctivae normal.     Pupils: Pupils are equal, round, and reactive to light.  Cardiovascular:     Rate and Rhythm: Normal rate and regular rhythm.     Heart sounds: No murmur heard. Pulmonary:     Effort: Pulmonary effort is normal. No respiratory distress.     Breath sounds: Normal breath sounds. No stridor. No wheezing, rhonchi or rales.  Abdominal:     Palpations: Abdomen is soft.     Tenderness: There is no abdominal tenderness.  Musculoskeletal:     Cervical back: Neck supple.     Comments:  There is bilateral pedal edema extending about midway of both shins.  There is a little bit of skin induration in the medial aspects of both ankles.  There is no ulceration at this time.  No rash or dermatitis seen either  Lymphadenopathy:     Cervical: No cervical adenopathy.  Skin:    Capillary Refill: Capillary refill takes less than 2 seconds.     Coloration: Skin is not pale.  Neurological:     General: No focal deficit present.     Mental Status: She is alert and oriented to person, place, and time.  Psychiatric:        Behavior: Behavior normal.      UC Treatments / Results  Labs (all labs ordered are listed, but only abnormal results are displayed) Labs Reviewed  CBC  TSH  COMPREHENSIVE METABOLIC PANEL    EKG   Radiology No results found.  Procedures Procedures (including critical care time)  Medications Ordered in UC Medications - No data to display  Initial Impression / Assessment and Plan / UC Course  I have reviewed the triage vital signs and the nursing notes.  Pertinent labs & imaging results that were available during my care of the patient were reviewed by me and considered in my medical decision making (see chart for details).        Other than the blood pressure, vital signs are normal and completely reassuring.  She does not have any symptoms referable to dehydration or low blood pressure.  The pedal edema seems most consistent with venous stasis CBC, BMP, and thyroid function labs are drawn today.  Will notify her if there is anything significantly abnormal. I am not going to attempt to prescribe any diuretics with her blood pressure being borderline low.  Compression stockings or socks are recommended.  I have asked her to follow-up with her primary care  We discussed trying to limit amount of salt she is eating.  Final Clinical Impressions(s) / UC Diagnoses   Final diagnoses:  Pedal edema     Discharge Instructions  We have  drawn blood today to check your blood counts, electrolytes and kidney and liver function, and thyroid levels.  Staff will notify you if there is anything significantly abnormal  Please purchase some compression socks and wear those.  Elevating your feet periodically can help.  Also limiting your salt or sodium intake can help.  Please follow-up with your primary care about this issue     ED Prescriptions   None    PDMP not reviewed this encounter.   Zenia Resides, MD 05/04/23 1311

## 2023-05-04 NOTE — ED Triage Notes (Signed)
Patient c/o bilateral feet swelling for 2 weeks. Reports that been elevating.

## 2023-05-04 NOTE — Discharge Instructions (Addendum)
We have drawn blood today to check your blood counts, electrolytes and kidney and liver function, and thyroid levels.  Staff will notify you if there is anything significantly abnormal  Please purchase some compression socks and wear those.  Elevating your feet periodically can help.  Also limiting your salt or sodium intake can help.  Please follow-up with your primary care about this issue

## 2023-05-16 ENCOUNTER — Encounter: Payer: Self-pay | Admitting: Certified Registered Nurse Anesthetist

## 2023-05-17 ENCOUNTER — Telehealth: Payer: Self-pay | Admitting: Gastroenterology

## 2023-05-17 NOTE — Telephone Encounter (Signed)
PT was exposed to someone with covid and wants to know will that affect her procedure on 8/15

## 2023-05-17 NOTE — Telephone Encounter (Signed)
Call to pt and advise pt to test for covid, if negative and asymptomatic, pt is ok to come in for procedure, instruct pt to call back if she has any questions

## 2023-05-20 ENCOUNTER — Telehealth: Payer: Self-pay | Admitting: Gastroenterology

## 2023-05-20 ENCOUNTER — Encounter: Payer: BC Managed Care – PPO | Admitting: Gastroenterology

## 2023-05-20 NOTE — Telephone Encounter (Signed)
Call to pt, spoke with Dr Chales Abrahams, states to give pt miralax prep next time with Reglan 10 mg before each prep dose, pt r/s for previsit, verb understanding

## 2023-05-20 NOTE — Telephone Encounter (Signed)
Inbound call from patient requesting to reschedule colonoscopy due to not being able to hold prep down. States after she took the prep medication she tried to drink the full amount of water but it did not stay down. Patient is now rescheduled for 10/9. Patient is also requesting for a call to discuss how to be able to hold down prep for 10/9 colonoscopy. Please advise, thank you.

## 2023-06-01 ENCOUNTER — Ambulatory Visit: Payer: Self-pay | Admitting: Nurse Practitioner

## 2023-06-08 ENCOUNTER — Encounter: Payer: Self-pay | Admitting: Nurse Practitioner

## 2023-06-08 ENCOUNTER — Ambulatory Visit: Payer: BC Managed Care – PPO | Admitting: Nurse Practitioner

## 2023-06-08 VITALS — BP 115/83 | HR 100 | Temp 98.1°F | Resp 12 | Ht 63.0 in | Wt 133.0 lb

## 2023-06-08 DIAGNOSIS — R7303 Prediabetes: Secondary | ICD-10-CM | POA: Diagnosis not present

## 2023-06-08 DIAGNOSIS — R600 Localized edema: Secondary | ICD-10-CM | POA: Diagnosis not present

## 2023-06-08 DIAGNOSIS — L853 Xerosis cutis: Secondary | ICD-10-CM

## 2023-06-08 LAB — POCT GLYCOSYLATED HEMOGLOBIN (HGB A1C): HbA1c, POC (prediabetic range): 5.9 % (ref 5.7–6.4)

## 2023-06-08 NOTE — Progress Notes (Signed)
Acute Office Visit  Subjective:     Patient ID: Kathy Gilbert, female    DOB: Mar 05, 1961, 62 y.o.   MRN: [id number]  Chief Complaint  Patient presents with   Rash    HPI Kathy Gilbert  has a past medical history of Acid reflux, Anemia, Anxiety, Paranoia (HCC), Seasonal allergies, and Vitamin D deficiency.  Patient of Dr. Jonah Blue  Patient presents with complaints of bilateral foot and leg swelling that started over the summer.  She also reports skin discoloration,, dry skin and sometimes itchiness, she denies leg pain, shortness of breath, chest pain.  States that she does a lot of sitting . She was at the urgent care on 05/04/2023 for the same complaints.  She is worried that she might have diabetes.  Labs done at the urgent care were normal.     Review of Systems  Constitutional:  Negative for activity change, appetite change, chills, fatigue and fever.  HENT:  Negative for congestion, dental problem, ear discharge, ear pain, hearing loss, rhinorrhea, sinus pressure, sinus pain, sneezing and sore throat.   Respiratory:  Negative for cough, chest tightness, shortness of breath and wheezing.   Cardiovascular:  Positive for leg swelling. Negative for chest pain and palpitations.  Gastrointestinal:  Negative for abdominal distention, abdominal pain, anal bleeding and blood in stool.  Genitourinary:  Negative for difficulty urinating, dysuria, flank pain, frequency, hematuria, menstrual problem, pelvic pain and vaginal bleeding.  Musculoskeletal:  Negative for arthralgias and back pain.  Skin:  Positive for color change. Negative for rash and wound.  Neurological:  Negative for dizziness, tremors, facial asymmetry, weakness and headaches.  Hematological:  Negative for adenopathy. Does not bruise/bleed easily.  Psychiatric/Behavioral:  Negative for agitation, behavioral problems, confusion, decreased concentration, hallucinations, self-injury and suicidal ideas.          Objective:    BP 115/83 (BP Location: Right Arm, Patient Position: Sitting, Cuff Size: Normal)   Pulse 100   Temp 98.1 F (36.7 C)   Resp 12   Ht 5\' 3"  (1.6 m)   Wt 133 lb (60.3 kg)   LMP 04/21/2016 (Approximate)   SpO2 99%   BMI 23.56 kg/m    Physical Exam Vitals and nursing note reviewed.  Constitutional:      General: She is not in acute distress.    Appearance: Normal appearance. She is not ill-appearing, toxic-appearing or diaphoretic.  HENT:     Mouth/Throat:     Mouth: Mucous membranes are moist.     Pharynx: Oropharynx is clear. No oropharyngeal exudate or posterior oropharyngeal erythema.  Eyes:     General: No scleral icterus.       Right eye: No discharge.        Left eye: No discharge.     Extraocular Movements: Extraocular movements intact.     Conjunctiva/sclera: Conjunctivae normal.  Cardiovascular:     Rate and Rhythm: Normal rate and regular rhythm.     Pulses: Normal pulses.     Heart sounds: Normal heart sounds. No murmur heard.    No friction rub. No gallop.  Pulmonary:     Effort: Pulmonary effort is normal. No respiratory distress.     Breath sounds: Normal breath sounds. No stridor. No wheezing, rhonchi or rales.  Chest:     Chest wall: No tenderness.  Abdominal:     General: There is no distension.     Palpations: Abdomen is soft.     Tenderness: There is no abdominal  tenderness. There is no right CVA tenderness, left CVA tenderness or guarding.  Musculoskeletal:        General: No swelling, tenderness, deformity or signs of injury.     Comments: Very mild edema noted on BLE. Skin appears dry, mild discoloration noted.   Skin:    General: Skin is warm and dry.     Capillary Refill: Capillary refill takes less than 2 seconds.     Coloration: Skin is not jaundiced or pale.     Findings: No bruising, erythema or lesion.  Neurological:     Mental Status: She is alert and oriented to person, place, and time.     Motor: No weakness.      Coordination: Coordination normal.     Gait: Gait normal.  Psychiatric:        Mood and Affect: Mood normal.        Behavior: Behavior normal.        Thought Content: Thought content normal.        Judgment: Judgment normal.     Results for orders placed or performed in visit on 06/08/23  POCT glycosylated hemoglobin (Hb A1C)  Result Value Ref Range   Hemoglobin A1C     HbA1c POC (<> result, manual entry)     HbA1c, POC (prediabetic range) 5.9 5.7 - 6.4 %   HbA1c, POC (controlled diabetic range)          Assessment & Plan:   Problem List Items Addressed This Visit       Musculoskeletal and Integument   Dry skin    Encouraged to keep skin well moisturized by using Cetaphil or Cetaphil lotion        Other   Bilateral leg edema - Primary    Very mild nonpitting edema noted on examination today No redness noted, skin warm and dry, has palpable +2 pedal pulses Patient counseled on DASH diet, encouraged to wear compression socks, keep legs elevated when sitting to help prevent swelling. We discussed referral to vascular today but she declined      Relevant Orders   POCT glycosylated hemoglobin (Hb A1C) (Completed)   Prediabetes    Lab Results  Component Value Date   HGBA1C 5.9 06/08/2023  Avoid sugar sweets soda       No orders of the defined types were placed in this encounter.   No follow-ups on file.  Kathy Beers, FNP

## 2023-06-08 NOTE — Assessment & Plan Note (Signed)
Very mild nonpitting edema noted on examination today No redness noted, skin warm and dry, has palpable +2 pedal pulses Patient counseled on DASH diet, encouraged to wear compression socks, keep legs elevated when sitting to help prevent swelling. We discussed referral to vascular today but she declined

## 2023-06-08 NOTE — Assessment & Plan Note (Signed)
Lab Results  Component Value Date   HGBA1C 5.9 06/08/2023  Avoid sugar sweets soda

## 2023-06-08 NOTE — Patient Instructions (Signed)
Please use CeraVe or Cetaphil moisturizing lotion daily to keep your skin from dry out  I encourage you to avoid salty foods, wear compression socks, keep your legs elevated when sitting to help prevent leg swelling.   It is important that you exercise regularly at least 30 minutes 5 times a week as tolerated  Think about what you will eat, plan ahead. Choose " clean, green, fresh or frozen" over canned, processed or packaged foods which are more sugary, salty and fatty. 70 to 75% of food eaten should be vegetables and fruit. Three meals at set times with snacks allowed between meals, but they must be fruit or vegetables. Aim to eat over a 12 hour period , example 7 am to 7 pm, and STOP after  your last meal of the day. Drink water,generally about 64 ounces per day, no other drink is as healthy. Fruit juice is best enjoyed in a healthy way, by EATING the fruit.  Thanks for choosing [facility] we consider it a privelige to serve you.

## 2023-06-08 NOTE — Assessment & Plan Note (Signed)
Encouraged to keep skin well moisturized by using Cetaphil or Cetaphil lotion

## 2023-06-16 ENCOUNTER — Other Ambulatory Visit: Payer: Self-pay

## 2023-06-16 ENCOUNTER — Ambulatory Visit (AMBULATORY_SURGERY_CENTER): Payer: BC Managed Care – PPO

## 2023-06-16 VITALS — Ht 63.0 in | Wt 130.0 lb

## 2023-06-16 DIAGNOSIS — Z1211 Encounter for screening for malignant neoplasm of colon: Secondary | ICD-10-CM

## 2023-06-16 MED ORDER — METOCLOPRAMIDE HCL 10 MG PO TABS
10.0000 mg | ORAL_TABLET | ORAL | 0 refills | Status: AC
Start: 2023-06-16 — End: ?

## 2023-06-16 NOTE — Progress Notes (Signed)
Denies allergies to eggs or soy products. Denies complication of anesthesia or sedation. Denies use of weight loss medication. Denies use of O2.   Emmi instructions given for colonoscopy.  

## 2023-06-21 ENCOUNTER — Ambulatory Visit: Payer: BC Managed Care – PPO | Admitting: Nurse Practitioner

## 2023-07-06 ENCOUNTER — Encounter: Payer: Self-pay | Admitting: Gastroenterology

## 2023-07-14 ENCOUNTER — Encounter: Payer: BC Managed Care – PPO | Admitting: Gastroenterology

## 2023-07-16 ENCOUNTER — Encounter: Payer: BC Managed Care – PPO | Admitting: Internal Medicine

## 2023-08-12 ENCOUNTER — Encounter: Payer: Self-pay | Admitting: Certified Registered Nurse Anesthetist

## 2023-08-13 ENCOUNTER — Ambulatory Visit: Payer: BC Managed Care – PPO | Admitting: Internal Medicine

## 2023-08-13 ENCOUNTER — Encounter: Payer: Self-pay | Admitting: Internal Medicine

## 2023-08-13 VITALS — BP 116/68 | HR 88 | Temp 98.1°F | Ht 63.0 in | Wt 133.0 lb

## 2023-08-13 DIAGNOSIS — Z1211 Encounter for screening for malignant neoplasm of colon: Secondary | ICD-10-CM

## 2023-08-13 MED ORDER — SUTAB 1479-225-188 MG PO TABS
12.0000 | ORAL_TABLET | ORAL | 0 refills | Status: AC
Start: 2023-08-13 — End: ?

## 2023-08-13 NOTE — Progress Notes (Unsigned)
Patient had broccoli with cheddar soup yesterday and only drank half of her bowel prep. She had some slight face swelling after drinking the first half of the prep so she didn't do the second half of the prep. She did not take her Reglan before consuming the prep. Will reschedule her colonoscopy and try to have her do the Sutab prep. I told her to call if there were any issues with her next prep. I also told her to take one dose of Reglan before each half of the prep.

## 2023-08-25 ENCOUNTER — Ambulatory Visit: Payer: Self-pay

## 2023-08-25 NOTE — Telephone Encounter (Signed)
  Chief Complaint: advice  Symptoms: NA Frequency:  Pertinent Negatives:  Disposition: [] ED /[] Urgent Care (no appt availability in office) / [] Appointment(In office/virtual)/ []  [facility]/ [x] Home Care/ [] Refused Recommended Disposition /[] [facility]/ []  Follow-up with PCP Additional Notes: pt states has had screening for coloscopy twice now. A1C 5.9 from 06/2023. Pt concerned if she needs to be seen for that. Advised we could schedule OV. Pt has upcoming CPE in 10/2023. Pt prefers to wait until that appt. Gave pt education on prediabetes and change in diet and exercise to help lower A1C and signs of hyperglycemia. Pt verbalized understanding. Will call back if she has any other concerns.    Summary: bowel movement every 2 days/no other symptoms   Patient has called and states she has went twice to have a screening for colonscopy and could not have her colonoscopy done, she previously went to [facility] for her screening for the colonoscopy and they told her that she was pre-diabetic and patient states since then, she is only having a bowel movement every 2 days and patient is seeking clinical advice if she needs to be seen for this? Patient states no abdominal pain, no other symptoms. Please advise.  Patients callback # [phone]         Reason for Disposition  Health Information question, no triage required and triager able to answer question  Answer Assessment - Initial Assessment Questions 1. REASON FOR CALL or QUESTION: "What is your reason for calling today?" or "How can I best help you?" or "What question do you have that I can help answer?"     Pt states been told prediabetic. Pt concerned and wanting to know if she needs to be seen  Protocols used: Information Only Call - No Triage-A-AH

## 2023-09-15 ENCOUNTER — Encounter: Payer: Self-pay | Admitting: Internal Medicine

## 2023-09-27 ENCOUNTER — Encounter: Payer: BC Managed Care – PPO | Admitting: Internal Medicine

## 2023-09-27 ENCOUNTER — Telehealth: Payer: Self-pay | Admitting: Internal Medicine

## 2023-09-27 NOTE — Telephone Encounter (Signed)
Patient called and stated she was not able to to come to her colonoscopy procedure schedule for today. Patient did reschedule for Feb 11 at 11:30. Please advise.

## 2023-10-05 ENCOUNTER — Encounter (HOSPITAL_COMMUNITY): Payer: Self-pay

## 2023-10-05 ENCOUNTER — Other Ambulatory Visit: Payer: Self-pay

## 2023-10-05 ENCOUNTER — Emergency Department (HOSPITAL_COMMUNITY)
Admission: EM | Admit: 2023-10-05 | Discharge: 2023-10-05 | Disposition: A | Discharge status: Home / Self Care | Payer: BC Managed Care – PPO | Attending: Emergency Medicine | Admitting: Emergency Medicine

## 2023-10-05 DIAGNOSIS — R002 Palpitations: Secondary | ICD-10-CM | POA: Insufficient documentation

## 2023-10-05 DIAGNOSIS — Z79899 Other long term (current) drug therapy: Secondary | ICD-10-CM | POA: Diagnosis not present

## 2023-10-05 LAB — CBC WITH DIFFERENTIAL/PLATELET
Abs Immature Granulocytes: 0.01 10*3/uL (ref 0.00–0.07)
Basophils Absolute: 0 10*3/uL (ref 0.0–0.1)
Basophils Relative: 1 %
Eosinophils Absolute: 0.1 10*3/uL (ref 0.0–0.5)
Eosinophils Relative: 2 %
HCT: 35.2 % — ABNORMAL LOW (ref 36.0–46.0)
Hemoglobin: 11.5 g/dL — ABNORMAL LOW (ref 12.0–15.0)
Immature Granulocytes: 0 %
Lymphocytes Relative: 31 %
Lymphs Abs: 1.9 10*3/uL (ref 0.7–4.0)
MCH: 29.3 pg (ref 26.0–34.0)
MCHC: 32.7 g/dL (ref 30.0–36.0)
MCV: 89.6 fL (ref 80.0–100.0)
Monocytes Absolute: 0.6 10*3/uL (ref 0.1–1.0)
Monocytes Relative: 10 %
Neutro Abs: 3.5 10*3/uL (ref 1.7–7.7)
Neutrophils Relative %: 56 %
Platelets: 202 10*3/uL (ref 150–400)
RBC: 3.93 MIL/uL (ref 3.87–5.11)
RDW: 13.9 % (ref 11.5–15.5)
WBC: 6.2 10*3/uL (ref 4.0–10.5)
nRBC: 0 % (ref 0.0–0.2)

## 2023-10-05 LAB — BASIC METABOLIC PANEL
Anion gap: 7 (ref 5–15)
BUN: 9 mg/dL (ref 6–20)
CO2: 23 mmol/L (ref 22–32)
Calcium: 8.6 mg/dL — ABNORMAL LOW (ref 8.9–10.3)
Chloride: 102 mmol/L (ref 98–111)
Creatinine, Ser: 0.56 mg/dL (ref 0.44–1.00)
GFR, Estimated: >60 mL/min (ref >60)
Glucose, Bld: 115 mg/dL — ABNORMAL HIGH (ref 70–99)
Potassium: 3.3 mmol/L — ABNORMAL LOW (ref 3.5–5.1)
Sodium: 132 mmol/L — ABNORMAL LOW (ref 135–145)

## 2023-10-05 LAB — TROPONIN I (HIGH SENSITIVITY)
Troponin I (High Sensitivity): 3 ng/L (ref <18)
Troponin I (High Sensitivity): 4 ng/L (ref <18)

## 2023-10-05 NOTE — Discharge Instructions (Signed)
Your sodium and potassium were little low.  Follow-up with your primary care doctor for this.  Your blood pressure was also elevated.  He will also need to be followed by your primary care doctor.

## 2023-10-05 NOTE — ED Provider Notes (Signed)
 [place] EMERGENCY DEPARTMENT AT [facility] Provider Note   CSN: [id number] Arrival date & time: 10/05/23  9771     History  Chief Complaint  Patient presents with   Palpitations    Kathy Gilbert is a 62 y.o. female.   Palpitations Patient presents with palpitations and feeling her heart racing.  Does have a history of anxiety.  Had been doing well.  Had episodes of this years ago but did not have them frequently.  Feeling somewhat better now.  No weight loss.  No stimulus.     Home Medications Prior to Admission medications   Medication Sig Start Date End Date Taking? Authorizing Provider  acetaminophen  (TYLENOL ) 500 MG tablet Take 1 tablet (500 mg total) by mouth every 6 (six) hours as needed. 02/22/15   Samtani, Jai-Gurmukh, MD  metoCLOPramide  (REGLAN ) 10 MG tablet Take 1 tablet (10 mg total) by mouth as directed. 06/16/23   Charlanne Groom, MD  Multiple Vitamin (MULTIVITAMIN) capsule Take 1 capsule by mouth daily.    [provider]  polyethylene glycol powder (GLYCOLAX/MIRALAX) 17 GM/SCOOP powder Take 1 Container by mouth once.    [provider]  SUTAB  [phone] MG TABS Take 12 tablets by mouth as directed. 08/13/23   Federico Rosario BROCKS, MD      Allergies    Omeprazole and Egg-derived products    Review of Systems   Review of Systems  Cardiovascular:  Positive for palpitations.    Physical Exam Updated Vital Signs BP (!) 137/94   Pulse 99   Temp 97.9 F (36.6 C) (Oral)   Resp 16   Ht 5' 3 (1.6 m)   Wt 60.3 kg   LMP 04/21/2016 (Approximate)   SpO2 100%   BMI 23.55 kg/m  Physical Exam Vitals and nursing note reviewed.  Cardiovascular:     Rate and Rhythm: Regular rhythm.  Pulmonary:     Breath sounds: No wheezing.  Abdominal:     Tenderness: There is no abdominal tenderness.  Musculoskeletal:     Cervical back: Neck supple.  Skin:    General: Skin is warm.  Neurological:     Mental Status: She is oriented to person,  place, and time.     ED Results / Procedures / Treatments   Labs (all labs ordered are listed, but only abnormal results are displayed) Labs Reviewed  CBC WITH DIFFERENTIAL/PLATELET - Abnormal; Notable for the following components:      Result Value   Hemoglobin 11.5 (*)    HCT 35.2 (*)    All other components within normal limits  BASIC METABOLIC PANEL - Abnormal; Notable for the following components:   Sodium 132 (*)    Potassium 3.3 (*)    Glucose, Bld 115 (*)    Calcium 8.6 (*)    All other components within normal limits  TROPONIN I (HIGH SENSITIVITY)  TROPONIN I (HIGH SENSITIVITY)    EKG EKG Interpretation Date/Time:  Tuesday October 05 2023 02:46:31 EST Ventricular Rate:  104 PR Interval:  155 QRS Duration:  76 QT Interval:  335 QTC Calculation: 441 R Axis:   84  Text Interpretation: Fast sinus arrhythmia Borderline right axis deviation Left ventricular hypertrophy No significant change since last tracing Confirmed by Emil Share [phone]) on 10/05/2023 7:57:18 AM  Radiology No results found.  Procedures Procedures    Medications Ordered in ED Medications - No data to display  ED Course/ Medical Decision Making/ A&P  Medical Decision Making Amount and/or Complexity of Data Reviewed Labs: ordered.   Patient with palpitations.  Feeling somewhat better now.  EKG reassuring.  Heart rate is come down.  I think like there is a least a component of anxiety.  She keeps watching the monitor nervously.  Basic blood work reassuring.  Potassium slightly low as is the sodium.  Do not think we need supplementation at this time.  Discharge home with PCP follow-up.  States he        Final Clinical Impression(s) / ED Diagnoses Final diagnoses:  Palpitations    Rx / DC Orders ED Discharge Orders     None         Patsey Lot, MD 10/05/23 [phone]

## 2023-10-05 NOTE — ED Triage Notes (Signed)
 Pt states that she woke up at approx 0200 feeling anxious and like her heart was racing. Pt reports that she has had similar symptoms with her anxiety. Denies CP/SOB

## 2023-10-14 ENCOUNTER — Ambulatory Visit: Payer: Self-pay | Attending: Internal Medicine | Admitting: Internal Medicine

## 2023-10-14 ENCOUNTER — Encounter: Payer: Self-pay | Admitting: Internal Medicine

## 2023-10-14 VITALS — BP 118/74 | HR 79 | Temp 97.9°F | Ht 63.0 in | Wt 130.0 lb

## 2023-10-14 DIAGNOSIS — Z2821 Immunization not carried out because of patient refusal: Secondary | ICD-10-CM

## 2023-10-14 DIAGNOSIS — R600 Localized edema: Secondary | ICD-10-CM

## 2023-10-14 DIAGNOSIS — H538 Other visual disturbances: Secondary | ICD-10-CM

## 2023-10-14 DIAGNOSIS — Z9189 Other specified personal risk factors, not elsewhere classified: Secondary | ICD-10-CM

## 2023-10-14 DIAGNOSIS — E876 Hypokalemia: Secondary | ICD-10-CM

## 2023-10-14 DIAGNOSIS — Z0000 Encounter for general adult medical examination without abnormal findings: Secondary | ICD-10-CM

## 2023-10-14 DIAGNOSIS — Z1211 Encounter for screening for malignant neoplasm of colon: Secondary | ICD-10-CM

## 2023-10-14 NOTE — Patient Instructions (Signed)
 Try to limit salt in the foods is much as possible.  Consider wearing compression socks during the day if you do a lot of standing at work.  Healthy Eating, Adult Healthy eating may help you get and keep a healthy body weight, reduce the risk of chronic disease, and live a long and productive life. It is important to follow a healthy eating pattern. Your nutritional and calorie needs should be met mainly by different nutrient-rich foods. What are tips for following this plan? Reading food labels Read labels and choose the following: Reduced or low sodium products. Juices with 100% fruit juice. Foods with low saturated fats (<3 g per serving) and high polyunsaturated and monounsaturated fats. Foods with whole grains, such as whole wheat, cracked wheat, brown rice, and wild rice. Whole grains that are fortified with folic acid. This is recommended for females who are pregnant or who want to become pregnant. Read labels and do not eat or drink the following: Foods or drinks with added sugars. These include foods that contain brown sugar, corn sweetener, corn syrup, dextrose, fructose, glucose, high-fructose corn syrup, honey, invert sugar, lactose, malt syrup, maltose, molasses, raw sugar, sucrose, trehalose, or turbinado sugar. Limit your intake of added sugars to less than 10% of your total daily calories. Do not eat more than the following amounts of added sugar per day: 6 teaspoons (25 g) for females. 9 teaspoons (38 g) for males. Foods that contain processed or refined starches and grains. Refined grain products, such as white flour, degermed cornmeal, white bread, and white rice. Shopping Choose nutrient-rich snacks, such as vegetables, whole fruits, and nuts. Avoid high-calorie and high-sugar snacks, such as potato chips, fruit snacks, and candy. Use oil-based dressings and spreads on foods instead of solid fats such as butter, margarine, sour cream, or cream cheese. Limit pre-made  sauces, mixes, and instant products such as flavored rice, instant noodles, and ready-made pasta. Try more plant-protein sources, such as tofu, tempeh, black beans, edamame, lentils, nuts, and seeds. Explore eating plans such as the Mediterranean diet or vegetarian diet. Try heart-healthy dips made with beans and healthy fats like hummus and guacamole. Vegetables go great with these. Cooking Use oil to saut or stir-fry foods instead of solid fats such as butter, margarine, or lard. Try baking, boiling, grilling, or broiling instead of frying. Remove the fatty part of meats before cooking. Steam vegetables in water or broth. Meal planning  At meals, imagine dividing your plate into fourths: One-half of your plate is fruits and vegetables. One-fourth of your plate is whole grains. One-fourth of your plate is protein, especially lean meats, poultry, eggs, tofu, beans, or nuts. Include low-fat dairy as part of your daily diet. Lifestyle Choose healthy options in all settings, including home, work, school, restaurants, or stores. Prepare your food safely: Wash your hands after handling raw meats. Where you prepare food, keep surfaces clean by regularly washing with hot, soapy water. Keep raw meats separate from ready-to-eat foods, such as fruits and vegetables. Cook seafood, meat, poultry, and eggs to the recommended temperature. Get a food thermometer. Store foods at safe temperatures. In general: Keep cold foods at 31F (4.4C) or below. Keep hot foods at 131F (60C) or above. Keep your freezer at [facility] (-17.8C) or below. Foods are not safe to eat if they have been between the temperatures of 40-131F (4.4-60C) for more than 2 hours. What foods should I eat? Fruits Aim to eat 1-2 cups of fresh, canned (in natural juice), or frozen  fruits each day. One cup of fruit equals 1 small apple, 1 large banana, 8 large strawberries, 1 cup (237 g) canned fruit,  cup (82 g) dried fruit, or 1  cup (240 mL) 100% juice. Vegetables Aim to eat 2-4 cups of fresh and frozen vegetables each day, including different varieties and colors. One cup of vegetables equals 1 cup (91 g) broccoli or cauliflower florets, 2 medium carrots, 2 cups (150 g) raw, leafy greens, 1 large tomato, 1 large bell pepper, 1 large sweet potato, or 1 medium white potato. Grains Aim to eat 5-10 ounce-equivalents of whole grains each day. Examples of 1 ounce-equivalent of grains include 1 slice of bread, 1 cup (40 g) ready-to-eat cereal, 3 cups (24 g) popcorn, or  cup (93 g) cooked rice. Meats and other proteins Try to eat 5-7 ounce-equivalents of protein each day. Examples of 1 ounce-equivalent of protein include 1 egg,  oz nuts (12 almonds, 24 pistachios, or 7 walnut halves), 1/4 cup (90 g) cooked beans, 6 tablespoons (90 g) hummus or 1 tablespoon (16 g) peanut butter. A cut of meat or fish that is the size of a deck of cards is about 3-4 ounce-equivalents (85 g). Of the protein you eat each week, try to have at least 8 sounce (227 g) of seafood. This is about 2 servings per week. This includes salmon, trout, herring, sardines, and anchovies. Dairy Aim to eat 3 cup-equivalents of fat-free or low-fat dairy each day. Examples of 1 cup-equivalent of dairy include 1 cup (240 mL) milk, 8 ounces (250 g) yogurt, 1 ounces (44 g) natural cheese, or 1 cup (240 mL) fortified soy milk. Fats and oils Aim for about 5 teaspoons (21 g) of fats and oils per day. Choose monounsaturated fats, such as canola and olive oils, mayonnaise made with olive oil or avocado oil, avocados, peanut butter, and most nuts, or polyunsaturated fats, such as sunflower, corn, and soybean oils, walnuts, pine nuts, sesame seeds, sunflower seeds, and flaxseed. Beverages Aim for 6 eight-ounce glasses of water per day. Limit coffee to 3-5 eight-ounce cups per day. Limit caffeinated beverages that have added calories, such as soda and energy drinks. If you drink  alcohol: Limit how much you have to: 0-1 drink a day if you are female. 0-2 drinks a day if you are female. Know how much alcohol is in your drink. In the [place] one drink is one 12 oz bottle of beer (355 mL), one 5 oz glass of wine (148 mL), or one 1 oz glass of hard liquor (44 mL). Seasoning and other foods Try not to add too much salt to your food. Try using herbs and spices instead of salt. Try not to add sugar to food. This information is based on [place] nutrition guidelines. To learn more, visit disposablenylon.be. Exact amounts may vary. You may need different amounts. This information is not intended to replace advice given to you by your health care provider. Make sure you discuss any questions you have with your health care provider. Document Revised: 06/22/2022 Document Reviewed: 06/22/2022 Elsevier Patient Education  2024 [employer].

## 2023-10-14 NOTE — Progress Notes (Signed)
 Patient ID: Kathy Gilbert, female    DOB: [birth year]  MRN: [id number]  CC: Annual Exam (Physical./Requesting referral for dermatology/Requesting imaging for head & abdomen/Discuss darkening of under eyes & heard pop on R side of face/No to flu vax. Yes to pap for another appt. )   Subjective: Kathy Gilbert is a 63 y.o. female who presents for  annual exam Her concerns today include:   Presents for physical.  Has [employer] but has not receive cards as yet.    HM:  Declines PAP smear.  Seen by GI for colon CA screening.  Had problems taking and tolerating the prep. Wants to do FIT instead today.  No fhx of colon CA.  Declines flu shot.  Had negative MMG 04/2023.     Patient Active Problem List   Diagnosis Date Noted   Bilateral leg edema 06/08/2023   Prediabetes 06/08/2023   Dry skin 06/08/2023   Screening breast examination 11/09/2019   Vitamin D  deficiency 09/07/2016   PARANOIA 11/30/2008   Headache above the eye region 11/30/2008   VISUAL ACUITY, DECREASED 06/20/2008   GERD 06/20/2008   HEMORRHOIDS, INTERNAL 04/17/2004   FIBROCYSTIC BREAST DISEASE 10/31/1997     Current Outpatient Medications on File Prior to Visit  Medication Sig Dispense Refill   acetaminophen  (TYLENOL ) 500 MG tablet Take 1 tablet (500 mg total) by mouth every 6 (six) hours as needed. (Patient not taking: Reported on 10/14/2023) 30 tablet 0   metoCLOPramide  (REGLAN ) 10 MG tablet Take 1 tablet (10 mg total) by mouth as directed. (Patient not taking: Reported on 10/14/2023) 2 tablet 0   Multiple Vitamin (MULTIVITAMIN) capsule Take 1 capsule by mouth daily. (Patient not taking: Reported on 10/14/2023)     polyethylene glycol powder (GLYCOLAX/MIRALAX) 17 GM/SCOOP powder Take 1 Container by mouth once. (Patient not taking: Reported on 10/14/2023)     SUTAB  [id number] MG TABS Take 12 tablets by mouth as directed. (Patient not taking: Reported on 10/14/2023) 24 tablet 0   No current facility-administered medications  on file prior to visit.    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   Highest education level: Master's degree (e.g., MA, MS, MEng, MEd, MSW, MBA)  Occupational History   Occupation: unemployed   Occupation: Architectural technologist  Tobacco Use   Smoking status: Never   Smokeless tobacco: Never  Vaping Use   Vaping status: Never Used  Substance and Sexual Activity   Alcohol use: No   Drug use: No   Sexual activity: Never  Other Topics Concern   Not on file  Social History Narrative   Not on file   Social Drivers of Health   Financial Resource Strain: Medium Risk (10/14/2023)   Overall Financial Resource Strain (CARDIA)    Difficulty of Paying Living Expenses: Somewhat hard  Food Insecurity: Food Insecurity Present (10/14/2023)   Hunger Vital Sign    Worried About Running Out of Food in the Last Year: Sometimes true    Ran Out of Food in the Last Year: Sometimes true  Transportation Needs: No Transportation Needs (10/14/2023)   PRAPARE - Administrator, Civil Service (Medical): No    Lack  of Transportation (Non-Medical): No  Physical Activity: Inactive (10/14/2023)   Exercise Vital Sign    Days of Exercise per Week: 0 days    Minutes of Exercise per Session: 0 min  Stress: No Stress Concern Present (10/14/2023)   [employer] of Occupational Health - Occupational Stress Questionnaire    Feeling of Stress : Not at all  Social Connections: Moderately Integrated (10/14/2023)   Social Connection and Isolation Panel [NHANES]    Frequency of Communication with Friends and Family: Once a week    Frequency of Social Gatherings with Friends and Family: Once a week    Attends Religious Services: More than  4 times per year    Active Member of [employer] or Organizations: Yes    Attends Banker Meetings: More than 4 times per year    Marital Status: Living with partner  Intimate Partner Violence: Not At Risk (10/14/2023)   Humiliation, Afraid, Rape, and Kick questionnaire    Fear of Current or Ex-Partner: No    Emotionally Abused: No    Physically Abused: No    Sexually Abused: No    Family History  Problem Relation Age of Onset   Diabetes Mother    Hypertension Mother    Breast cancer Neg Hx    Colon polyps Neg Hx    Colon cancer Neg Hx    Esophageal cancer Neg Hx    Rectal cancer Neg Hx    Stomach cancer Neg Hx     Past Surgical History:  Procedure Laterality Date   BREAST BIOPSY      ROS: Review of Systems  Constitutional:        Walks daily.  Thinks she does okay with eating habits.  However on further questioning she tells me that she drinks juices, sodas and sweet tea.  HENT:  Negative for congestion, hearing loss and trouble swallowing.   Eyes:        Reports some blurred vision in the eyes more so of the right eye.  She is wanting a CAT scan of the head because she has noticed dark circles around the eyes and under the eyes.  She wants to know if anything is going on in her brain.  Respiratory:  Negative for cough and shortness of breath.   Cardiovascular:  Negative for chest pain.  Gastrointestinal:  Negative for abdominal pain.       Reports she is moving her bowels okay.  No blood in the stools.  Want CAT scan of her abdomen just to make sure that everything is okay inside of her.  Denies any nausea, vomiting, chronic abdominal pain, changes in bowel habits.  Genitourinary:  Negative for difficulty urinating and hematuria.       She is postmenopausal.  Skin:        Request referral to dermatology to have her scalp checked.  She states that about 5 to 7 years ago she had a scalp laceration on the left side.  She wants to make sure that everything has healed  properly because she never got stitches.  Psychiatric/Behavioral:         Denies any major issues with depression or anxiety.    PHYSICAL EXAM: BP 118/74 (BP Location: Left Arm, Patient Position: Sitting, Cuff Size: Normal)   Pulse 79   Temp 97.9 F (36.6 C) (Oral)   Ht 5' 3 (1.6 m)   Wt 130 lb (59 kg)   LMP 04/21/2016 (Approximate)   SpO2 100%  BMI 23.03 kg/m   Wt Readings from Last 3 Encounters:  10/14/23 130 lb (59 kg)  10/05/23 132 lb 15 oz (60.3 kg)  08/13/23 133 lb (60.3 kg)    Physical Exam  General appearance - alert, well appearing, older African-American female and in no distress Mental status -patient with odd affect and quiet demeanor Eyes - pupils equal and reactive, extraocular eye movements intact Ears - bilateral TM's and external ear canals normal Nose - normal and patent, no erythema, discharge or polyps Mouth - mucous membranes moist, pharynx normal without lesions Neck - supple, no significant adenopathy Lymphatics - no palpable lymphadenopathy, no hepatosplenomegaly Chest - clear to auscultation, no wheezes, rales or rhonchi, symmetric air entry Heart - normal rate, regular rhythm, normal S1, S2, no murmurs, rubs, clicks or gallops Abdomen - soft, nontender, nondistended, no masses or organomegaly Pelvic -patient declined Pap smear Musculoskeletal - no joint tenderness, deformity or swelling Extremities -trace bilateral lower extremity edema Skin -patient was wearing a week.  I had her removed to show me the area on the scalp that she is concerned about.  We were not able to find any areas of concern.    10/14/2023    4:22 PM 06/08/2023    2:37 PM 10/01/2022    3:19 PM  Depression screen PHQ 2/9  Decreased Interest 1 0 0  Down, Depressed, Hopeless 0 0 0  PHQ - 2 Score 1 0 0  Altered sleeping 1 0 0  Tired, decreased energy 1 0 0  Change in appetite 0 0 0  Feeling bad or failure about yourself  0 0 0  Trouble concentrating 1 0 1  Moving slowly or  fidgety/restless 1 0 0  Suicidal thoughts 0 0 0  PHQ-9 Score 5 0 1  Difficult doing work/chores Not difficult at all        10/14/2023    4:22 PM 10/01/2022    3:19 PM 10/23/2021    2:08 PM 08/21/2021   10:44 AM  GAD 7 : Generalized Anxiety Score  Nervous, Anxious, on Edge 0 0 0 0  Control/stop worrying 0 0 0 0  Worry too much - different things 0 0 0 0  Trouble relaxing 1 1 0 0  Restless 0 0 0 0  Easily annoyed or irritable 1 1 0 0  Afraid - awful might happen 0 0 0 0  Total GAD 7 Score 2 2 0 0  Anxiety Difficulty Not difficult at all   Not difficult at all         Latest Ref Rng & Units 10/05/2023    3:04 AM 05/04/2023    1:30 PM 10/01/2022    4:11 PM  CMP  Glucose 70 - 99 mg/dL 884  82  81   BUN 6 - 20 mg/dL 9  8  11    Creatinine 0.44 - 1.00 mg/dL 9.43  9.29  9.39   Sodium 135 - 145 mmol/L 132  138  141   Potassium 3.5 - 5.1 mmol/L 3.3  4.1  3.9   Chloride 98 - 111 mmol/L 102  104  102   CO2 22 - 32 mmol/L 23  28  23    Calcium 8.9 - 10.3 mg/dL 8.6  9.2  9.5   Total Protein 6.5 - 8.1 g/dL  7.3  6.9   Total Bilirubin 0.3 - 1.2 mg/dL  0.5  0.4   Alkaline Phos 38 - 126 U/L  82  107   AST 15 -  41 U/L  23  40   ALT 0 - 44 U/L  20  58    Lipid Panel     Component Value Date/Time   CHOL 198 10/01/2022 1611   TRIG 35 10/01/2022 1611   HDL 95 10/01/2022 1611   CHOLHDL 2.1 10/01/2022 1611   LDLCALC 96 10/01/2022 1611    CBC    Component Value Date/Time   WBC 6.2 10/05/2023 0304   RBC 3.93 10/05/2023 0304   HGB 11.5 (L) 10/05/2023 0304   HGB 12.1 10/01/2022 1611   HCT 35.2 (L) 10/05/2023 0304   HCT 36.9 10/01/2022 1611   PLT 202 10/05/2023 0304   PLT 210 10/01/2022 1611   MCV 89.6 10/05/2023 0304   MCV 88 10/01/2022 1611   MCH 29.3 10/05/2023 0304   MCHC 32.7 10/05/2023 0304   RDW 13.9 10/05/2023 0304   RDW 13.0 10/01/2022 1611   LYMPHSABS 1.9 10/05/2023 0304   MONOABS 0.6 10/05/2023 0304   EOSABS 0.1 10/05/2023 0304   BASOSABS 0.0 10/05/2023 0304     ASSESSMENT AND PLAN: 1. Annual physical exam (Primary) Discussed and encouraged healthy eating habits.  Encouraged her to avoid sugary drinks. Continue daily walks. Encouraged her to see a dentist at least twice a year for routine cleaning. Advised patient that CAT scan of the head and abdomen and not indicated as she has no concerning symptoms to warrant ordering these advanced imaging studies. Also I did not find any area of concern on her scalp to warrant referral to dermatology.  I told her that if she had sustained a laceration 5 to 7 years ago, certainly it has healed.  2. Screening for colon cancer - Fecal occult blood, imunochemical([facility])  3. Blurred vision I offered referral to ophthalmology but patient states she will make the appointment herself.  4. Need for dental care See #1 above.  5. Hypokalemia Will recheck chemistry to see if potassium level has normalized - Basic Metabolic Panel; Future  6. Bilateral leg edema DASH diet discussed and encouraged.  Recommend wearing compression socks if she is on her feet a lot during the day.  7. Influenza vaccination declined   Patient was given the opportunity to ask questions.  Patient verbalized understanding of the plan and was able to repeat key elements of the plan.   This documentation was completed using Paediatric nurse.  Any transcriptional errors are unintentional.  Orders Placed This Encounter  Procedures   Fecal occult blood, imunochemical([facility])   Basic Metabolic Panel     Requested Prescriptions    No prescriptions requested or ordered in this encounter    Return if symptoms worsen or fail to improve, for Give lab appt for tomorrow or next week.SABRA Barnie Louder, MD, FACP

## 2023-10-15 LAB — FECAL OCCULT BLOOD, IMMUNOCHEMICAL: Fecal Occult Bld: NEGATIVE

## 2023-10-25 ENCOUNTER — Other Ambulatory Visit: Payer: Self-pay

## 2023-10-27 ENCOUNTER — Telehealth: Payer: Self-pay

## 2023-10-27 NOTE — Telephone Encounter (Signed)
Called patient for pre visit but there was a conflict so patient rescheduled pre visit and colonoscopy.

## 2023-11-01 ENCOUNTER — Other Ambulatory Visit: Payer: Self-pay

## 2023-11-16 ENCOUNTER — Encounter: Payer: BC Managed Care – PPO | Admitting: Internal Medicine

## 2023-12-03 ENCOUNTER — Other Ambulatory Visit: Payer: Self-pay

## 2023-12-13 ENCOUNTER — Other Ambulatory Visit: Payer: Self-pay

## 2024-01-03 ENCOUNTER — Ambulatory Visit: Payer: Self-pay

## 2024-01-17 ENCOUNTER — Telehealth: Payer: Self-pay

## 2024-01-17 ENCOUNTER — Ambulatory Visit: Payer: Self-pay | Attending: Internal Medicine

## 2024-01-17 DIAGNOSIS — E876 Hypokalemia: Secondary | ICD-10-CM

## 2024-01-17 NOTE — Telephone Encounter (Signed)
 NO SHOWED PV.  No call back to reschedule pre visit.  Colonoscopy and PV were cancelled and letter mailed and sent in [facility]

## 2024-01-17 NOTE — Telephone Encounter (Signed)
 Left patient a message letting her know that I had called for her pre visit, and she needed to call back by end of day and reschedule her pre visit or her colonoscopy would be cancelled.

## 2024-01-18 ENCOUNTER — Encounter: Payer: Self-pay | Admitting: Internal Medicine

## 2024-01-18 LAB — BASIC METABOLIC PANEL WITH GFR
BUN/Creatinine Ratio: 17 (ref 12–28)
BUN: 11 mg/dL (ref 8–27)
CO2: 23 mmol/L (ref 20–29)
Calcium: 9.1 mg/dL (ref 8.7–10.3)
Chloride: 105 mmol/L (ref 96–106)
Creatinine, Ser: 0.65 mg/dL (ref 0.57–1.00)
Glucose: 79 mg/dL (ref 70–99)
Potassium: 4.5 mmol/L (ref 3.5–5.2)
Sodium: 141 mmol/L (ref 134–144)
eGFR: 101 mL/min/{1.73_m2} (ref >59)

## 2024-02-03 ENCOUNTER — Encounter: Payer: BC Managed Care – PPO | Admitting: Internal Medicine

## 2024-04-10 ENCOUNTER — Ambulatory Visit
Admission: RE | Admit: 2024-04-10 | Discharge: 2024-04-10 | Disposition: A | Discharge status: Home / Self Care | Source: Ambulatory Visit | Attending: Internal Medicine | Admitting: Internal Medicine

## 2024-04-10 ENCOUNTER — Ambulatory Visit: Payer: BC Managed Care – PPO

## 2024-04-10 DIAGNOSIS — Z1231 Encounter for screening mammogram for malignant neoplasm of breast: Secondary | ICD-10-CM

## 2024-04-13 ENCOUNTER — Ambulatory Visit: Payer: Self-pay | Admitting: Internal Medicine

## 2024-12-11 ENCOUNTER — Ambulatory Visit: Payer: Self-pay | Admitting: Internal Medicine
